# Patient Record
Sex: Male | Born: 2000 | Race: Black or African American | Hispanic: No | Marital: Single | Smoking: Never smoker
Health system: Southern US, Community
[De-identification: ages and names within clinical notes are randomized; demographics above are authoritative.]

## PROBLEM LIST (undated history)

## (undated) DIAGNOSIS — I219 Acute myocardial infarction, unspecified: Secondary | ICD-10-CM

---
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
## 2021-02-04 ENCOUNTER — Other Ambulatory Visit: Payer: Self-pay

## 2021-02-04 ENCOUNTER — Encounter: Payer: Self-pay | Admitting: Emergency Medicine

## 2021-02-04 ENCOUNTER — Observation Stay: Payer: Medicaid Other

## 2021-02-04 ENCOUNTER — Inpatient Hospital Stay
Admission: EM | Admit: 2021-02-04 | Discharge: 2021-02-08 | DRG: 282 | Disposition: A | Discharge status: Home / Self Care | Payer: Medicaid Other | Attending: Internal Medicine | Admitting: Internal Medicine

## 2021-02-04 ENCOUNTER — Emergency Department: Payer: Medicaid Other

## 2021-02-04 DIAGNOSIS — E785 Hyperlipidemia, unspecified: Secondary | ICD-10-CM | POA: Diagnosis present

## 2021-02-04 DIAGNOSIS — R079 Chest pain, unspecified: Secondary | ICD-10-CM | POA: Diagnosis not present

## 2021-02-04 DIAGNOSIS — E876 Hypokalemia: Secondary | ICD-10-CM | POA: Diagnosis not present

## 2021-02-04 DIAGNOSIS — I214 Non-ST elevation (NSTEMI) myocardial infarction: Principal | ICD-10-CM | POA: Diagnosis present

## 2021-02-04 DIAGNOSIS — D539 Nutritional anemia, unspecified: Secondary | ICD-10-CM | POA: Diagnosis present

## 2021-02-04 DIAGNOSIS — E8881 Metabolic syndrome: Secondary | ICD-10-CM | POA: Diagnosis present

## 2021-02-04 DIAGNOSIS — I2510 Atherosclerotic heart disease of native coronary artery without angina pectoris: Secondary | ICD-10-CM | POA: Diagnosis present

## 2021-02-04 DIAGNOSIS — Z6837 Body mass index (BMI) 37.0-37.9, adult: Secondary | ICD-10-CM

## 2021-02-04 DIAGNOSIS — Z20822 Contact with and (suspected) exposure to covid-19: Secondary | ICD-10-CM | POA: Diagnosis present

## 2021-02-04 DIAGNOSIS — E669 Obesity, unspecified: Secondary | ICD-10-CM | POA: Diagnosis present

## 2021-02-04 DIAGNOSIS — I255 Ischemic cardiomyopathy: Secondary | ICD-10-CM | POA: Diagnosis present

## 2021-02-04 DIAGNOSIS — K760 Fatty (change of) liver, not elsewhere classified: Secondary | ICD-10-CM | POA: Diagnosis present

## 2021-02-04 DIAGNOSIS — R778 Other specified abnormalities of plasma proteins: Secondary | ICD-10-CM

## 2021-02-04 DIAGNOSIS — D519 Vitamin B12 deficiency anemia, unspecified: Secondary | ICD-10-CM | POA: Diagnosis present

## 2021-02-04 DIAGNOSIS — I119 Hypertensive heart disease without heart failure: Secondary | ICD-10-CM | POA: Diagnosis present

## 2021-02-04 LAB — CBC
HCT: 32.6 % — ABNORMAL LOW (ref 39.0–52.0)
Hemoglobin: 11.7 g/dL — ABNORMAL LOW (ref 13.0–17.0)
MCH: 36.7 pg — ABNORMAL HIGH (ref 26.0–34.0)
MCHC: 35.9 g/dL (ref 30.0–36.0)
MCV: 102.2 fL — ABNORMAL HIGH (ref 80.0–100.0)
Platelets: 422 10*3/uL — ABNORMAL HIGH (ref 150–400)
RBC: 3.19 MIL/uL — ABNORMAL LOW (ref 4.22–5.81)
RDW: 18 % — ABNORMAL HIGH (ref 11.5–15.5)
WBC: 15.2 10*3/uL — ABNORMAL HIGH (ref 4.0–10.5)
nRBC: 0.2 % (ref 0.0–0.2)

## 2021-02-04 LAB — BASIC METABOLIC PANEL
Anion gap: 10 (ref 5–15)
BUN: 14 mg/dL (ref 6–20)
CO2: 23 mmol/L (ref 22–32)
Calcium: 9.8 mg/dL (ref 8.9–10.3)
Chloride: 106 mmol/L (ref 98–111)
Creatinine, Ser: 1.02 mg/dL (ref 0.61–1.24)
GFR, Estimated: >60 mL/min (ref >60)
Glucose, Bld: 160 mg/dL — ABNORMAL HIGH (ref 70–99)
Potassium: 3.8 mmol/L (ref 3.5–5.1)
Sodium: 139 mmol/L (ref 135–145)

## 2021-02-04 LAB — TROPONIN I (HIGH SENSITIVITY)
Troponin I (High Sensitivity): 236 ng/L (ref <18)
Troponin I (High Sensitivity): 426 ng/L (ref <18)

## 2021-02-04 LAB — RESP PANEL BY RT-PCR (FLU A&B, COVID) ARPGX2
Influenza A by PCR: NEGATIVE
Influenza B by PCR: NEGATIVE
SARS Coronavirus 2 by RT PCR: NEGATIVE

## 2021-02-04 MED ORDER — IOHEXOL 350 MG/ML SOLN
100.0000 mL | Freq: Once | INTRAVENOUS | Status: AC | PRN
  Administered 2021-02-04: 100 mL via INTRAVENOUS

## 2021-02-04 MED ORDER — ALUM & MAG HYDROXIDE-SIMETH 200-200-20 MG/5ML PO SUSP: 15.0000 mL | ORAL | Status: DC | PRN

## 2021-02-04 MED ORDER — COLCHICINE 0.6 MG PO TABS
0.6000 mg | ORAL_TABLET | Freq: Two times a day (BID) | ORAL | Status: DC
  Administered 2021-02-05 – 2021-02-06 (×5): 0.6 mg via ORAL
  Filled 2021-02-04 (×7): qty 1

## 2021-02-04 MED ORDER — ASPIRIN 325 MG PO TABS
325.0000 mg | ORAL_TABLET | Freq: Every day | ORAL | Status: DC
  Filled 2021-02-04: qty 1

## 2021-02-04 MED ORDER — ASPIRIN 81 MG PO CHEW: 81.0000 mg | CHEWABLE_TABLET | Freq: Every day | ORAL | Status: DC

## 2021-02-04 MED ORDER — IBUPROFEN 600 MG PO TABS
600.0000 mg | ORAL_TABLET | Freq: Four times a day (QID) | ORAL | Status: DC
  Administered 2021-02-05 (×2): 600 mg via ORAL
  Filled 2021-02-04 (×2): qty 1

## 2021-02-04 MED ORDER — ASPIRIN 81 MG PO CHEW
324.0000 mg | CHEWABLE_TABLET | Freq: Once | ORAL | Status: AC
  Administered 2021-02-04: 324 mg via ORAL

## 2021-02-04 MED ORDER — NITROGLYCERIN 0.4 MG SL SUBL: 0.4000 mg | SUBLINGUAL_TABLET | Freq: Once | SUBLINGUAL | Status: AC

## 2021-02-04 MED ORDER — ACETAMINOPHEN 325 MG PO TABS
650.0000 mg | ORAL_TABLET | ORAL | Status: DC | PRN
  Administered 2021-02-04 – 2021-02-07 (×2): 650 mg via ORAL
  Filled 2021-02-04: qty 2

## 2021-02-04 MED ORDER — NITROGLYCERIN 0.4 MG SL SUBL
SUBLINGUAL_TABLET | SUBLINGUAL | Status: AC
  Administered 2021-02-04: 0.4 mg
  Filled 2021-02-04: qty 1

## 2021-02-04 MED ORDER — HYDROCHLOROTHIAZIDE 12.5 MG PO CAPS
12.5000 mg | ORAL_CAPSULE | Freq: Every day | ORAL | Status: DC
  Administered 2021-02-05: 12.5 mg via ORAL
  Filled 2021-02-04: qty 1

## 2021-02-04 MED ORDER — ENOXAPARIN SODIUM 80 MG/0.8ML IJ SOSY
0.5000 mg/kg | PREFILLED_SYRINGE | INTRAMUSCULAR | Status: DC
  Administered 2021-02-04: 62.5 mg via SUBCUTANEOUS
  Filled 2021-02-04: qty 0.63

## 2021-02-04 MED ORDER — ONDANSETRON HCL 4 MG/2ML IJ SOLN: 4.0000 mg | Freq: Four times a day (QID) | INTRAMUSCULAR | Status: DC | PRN

## 2021-02-04 MED ORDER — ASPIRIN 81 MG PO CHEW
324.0000 mg | CHEWABLE_TABLET | Freq: Every day | ORAL | Status: DC
  Filled 2021-02-04: qty 4

## 2021-02-04 MED ORDER — COLCHICINE 0.6 MG PO TABS
0.6000 mg | ORAL_TABLET | Freq: Every day | ORAL | Status: DC
  Filled 2021-02-04: qty 1

## 2021-02-04 NOTE — ED Triage Notes (Signed)
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 Pt describes the pain in his chest as someone sitting on it. Pt reports felt nauseated as well.

## 2021-02-04 NOTE — Progress Notes (Signed)
Called to evaluate for possible code STEMI. Young, otherwise healthy male, first noted chest discomfort 02/02/2021, more CP today with nausea, presents to ED, ECG shows NSR with nonspecific ST elevation aVR only, with nonspecific T wave inversion lead III only. Admission labs notable for mildly elevated troponin 236, with elevated WBC 15,200 of uncertain etiology. No history for cocaine use. Possible myocarditis/pericarditis. No symptoms concerning for COVID. Does not meet criteria for STEMI or emergent cardiac catheterization. Would admit for observation, cardiology consultation, cycle cardiac isoenzymes, 2D echocardiogram.

## 2021-02-04 NOTE — Progress Notes (Signed)
PHARMACIST - PHYSICIAN COMMUNICATION  CONCERNING:  Enoxaparin (Lovenox) for DVT Prophylaxis    RECOMMENDATION: Patient was prescribed enoxaprin 40mg  q24 hours for VTE prophylaxis.   Filed Weights   02/04/21 1650  Weight: 127 kg (280 lb)    Body mass index is 37.97 kg/m.  Estimated Creatinine Clearance: 159.2 mL/min (by C-G formula based on SCr of 1.02 mg/dL).   Based on [facility] policy patient is candidate for enoxaparin 0.5mg /kg TBW SQ every 24 hours based on BMI being >30.   DESCRIPTION: Pharmacy has adjusted enoxaparin dose per [facility] policy.  Patient is now receiving enoxaparin 62.5 mg every 24 hours    [facility], PharmD Clinical Pharmacist  02/04/2021 8:09 PM

## 2021-02-04 NOTE — ED Notes (Signed)
Pt placed in hospital bed for comfort. Lights dimmed for comfort. Father with pt- updated on plan of care per pt request.

## 2021-02-04 NOTE — ED Notes (Signed)
Anwar, DO notified of 4 beats of v-tach at 2200.

## 2021-02-04 NOTE — ED Provider Notes (Signed)
[facility] Emergency Department Provider Note  ____________________________________________   I have reviewed the triage vital signs and the nursing notes.   HISTORY  Chief Complaint Chest Pain   History limited by: Not Limited   HPI Peter Monroe is a 20 y.o. male who presents to the emergency department today because of concern for chest pain. The patient states that the chest pain started two days ago. Located in the left center chest. Was getting slightly better, however today was painting and started feeling the pain again and it was worse. The patient denies any shortness of breath. Did have one episode of nausea and vomiting. The patient denies any recent illness, fevers. No recent cold like symptoms.    Records reviewed. Per medical record review no pertinent family history.   History reviewed. No pertinent past medical history.  There are no problems to display for this patient.   History reviewed. No pertinent surgical history.  Prior to Admission medications   Not on File    Allergies Patient has no known allergies.  No family history on file.  Social History Positive tobacco use Negative alcohol   Review of Systems Constitutional: No fever/chills Eyes: No visual changes. ENT: No sore throat. Cardiovascular: Positive for chest pain. Respiratory: Denies shortness of breath. Gastrointestinal: No abdominal pain.  Positive for nausea and vomiting.  Genitourinary: Negative for dysuria. Musculoskeletal: Negative for back pain. Skin: Negative for rash. Neurological: Negative for headaches, focal weakness or numbness.  ____________________________________________   PHYSICAL EXAM:  VITAL SIGNS: ED Triage Vitals  Enc Vitals Group     BP 02/04/21 1652 (!) 148/106     Pulse Rate 02/04/21 1652 86     Resp 02/04/21 1652 20     Temp 02/04/21 1652 98.7 F (37.1 C)     Temp Source 02/04/21 1652 Oral     SpO2 02/04/21 1652 100 %      Weight 02/04/21 1650 280 lb (127 kg)     Height 02/04/21 1650 6' (1.829 m)     Head Circumference --      Peak Flow --      Pain Score 02/04/21 1649 9   Constitutional: Alert and oriented.  Eyes: Conjunctivae are normal.  ENT      Head: Normocephalic and atraumatic.      Nose: No congestion/rhinnorhea.      Mouth/Throat: Mucous membranes are moist.      Neck: No stridor. Hematological/Lymphatic/Immunilogical: No cervical lymphadenopathy. Cardiovascular: Normal rate, regular rhythm.  No murmurs, rubs, or gallops.  Respiratory: Normal respiratory effort without tachypnea nor retractions. Breath sounds are clear and equal bilaterally. No wheezes/rales/rhonchi. Gastrointestinal: Soft and non tender. No rebound. No guarding.  Genitourinary: Deferred Musculoskeletal: Normal range of motion in all extremities. No lower extremity edema. Neurologic:  Normal speech and language. No gross focal neurologic deficits are appreciated.  Skin:  Skin is warm, dry and intact. No rash noted. Psychiatric: Mood and affect are normal. Speech and behavior are normal. Patient exhibits appropriate insight and judgment.  ____________________________________________    LABS (pertinent positives/negatives)  BMP wnl except glu 160 Trop hs 236 CBC wbc 15.2, hgb 11.7, plt 422  ____________________________________________   EKG  I, Phineas Semen, attending physician, personally viewed and interpreted this EKG  EKG Time: 1650 Rate: 81 Rhythm: sinus rhythm Axis: normal Intervals: qtc 429 QRS: narrow ST changes: st depression v4, v5, st elevation avr Impression: abnormal ekg   ____________________________________________    RADIOLOGY  CXR No active  cardiopulmonary disease  ____________________________________________   PROCEDURES  Procedures  CRITICAL CARE Performed by: Phineas Semen   Total critical care time: 35 minutes  Critical care time was exclusive of separately  billable procedures and treating other patients.  Critical care was necessary to treat or prevent imminent or life-threatening deterioration.  Critical care was time spent personally by me on the following activities: development of treatment plan with patient and/or surrogate as well as nursing, discussions with consultants, evaluation of patient's response to treatment, examination of patient, obtaining history from patient or surrogate, ordering and performing treatments and interventions, ordering and review of laboratory studies, ordering and review of radiographic studies, pulse oximetry and re-evaluation of patient's condition.  ____________________________________________   INITIAL IMPRESSION / ASSESSMENT AND PLAN / ED COURSE  Pertinent labs & imaging results that were available during my care of the patient were reviewed by me and considered in my medical decision making (see chart for details).   Patient presents to the emergency department today because of concern for chest pain. Initial troponin was elevated to 230s. The patient's ekg did show some ST depressions as well as ST elevation in avr. Did discuss with Dr. Darrold Junker with cardiology. At this time did not feel any emergent catheterization was warranted. Will plan on admission to the hospitalist.    ____________________________________________   FINAL CLINICAL IMPRESSION(S) / ED DIAGNOSES  Final diagnoses:  Chest pain, unspecified type  Elevated troponin     Note: This dictation was prepared with Dragon dictation. Any transcriptional errors that result from this process are unintentional     Phineas Semen, MD 02/04/21 1931

## 2021-02-04 NOTE — ED Notes (Signed)
Pt and family updated on current plan of care. Introduced self to [employer]. Pt given additional pillow, position adjusted, and pt given TV remote. Pt denies additional needs at this time.

## 2021-02-04 NOTE — H&P (Addendum)
History and Physical    Peter Monroe ION:[id number] DOB: [birth year] DOA: 02/04/2021  PCP: Oswaldo Conroy, MD  Chief Complaint: Chest pain  HPI: Peter Monroe is a 20 y.o. male with a past medical history of obesity, BMI 37, tobacco use disorder, marijuana use, diagnosed with elevated blood pressure at 20 years of age but not started on any oral antihypertensives.  The patient presents to the emergency department for chest pain that started 2 days ago.  Intermittent in nature.  Located center of the chest and right-sided.  Made worse today with exertion when he was outside painting with his dad.  Made better with aspirin.  Initial pain was 9 out of 10 in the emergency department.  Currently now 5 out of 10.  Non-palpational.  Nonpositional.  It is exertional.  He had 1 episode of nausea and vomiting.  No family history of myocardial infarction.  No cocaine use.  Denies any abdominal pain.  Denies any urinary complaints.  Denies any fevers or chills.  Denies any upper respiratory symptoms.  States he had upper respiratory symptoms approximately 1 or 2 months ago.    ED Course: Lab work obtained.  EKG obtained.  Chest x-ray obtained.  The ED provider did discuss the case with Dr. Darrold Junker.  Review of Systems: 14 point review of systems is negative except for what is mentioned above in the HPI.   History reviewed. No pertinent past medical history.  History reviewed. No pertinent surgical history.  Social History   Socioeconomic History   Marital status: Single    Spouse name: Not on file   Number of children: Not on file   Years of education: Not on file   Highest education level: Not on file  Occupational History   Not on file  Tobacco Use   Smoking status: Never   Smokeless tobacco: Never  Substance and Sexual Activity   Alcohol use: No    Alcohol/week: 0.0 standard drinks   Drug use: No   Sexual activity: Not on file  Other Topics Concern   Not on file  Social  History Narrative   Not on file   Social Determinants of Health   Financial Resource Strain: Not on file  Food Insecurity: Not on file  Transportation Needs: Not on file  Physical Activity: Not on file  Stress: Not on file  Social Connections: Not on file  Intimate Partner Violence: Not on file    No Known Allergies  No family history on file.  Prior to Admission medications   Not on File    Physical Exam: Vitals:   02/04/21 1920 02/04/21 1925 02/04/21 1930 02/04/21 2000  BP: (!) 144/84 (!) 144/86 139/77 (!) 143/79  Pulse: 78 75 73 67  Resp: 14 18 17 11   Temp:      TempSrc:      SpO2: 98% 100% 100% 99%  Weight:      Height:         General:  Appears calm and comfortable and is in NAD Cardiovascular:  RRR, no m/r/g.  Respiratory:   CTA bilaterally with no wheezes/rales/rhonchi.  Normal respiratory effort. Abdomen:  soft, NT, ND, NABS Skin:  no rash or induration seen on limited exam Musculoskeletal:  grossly normal tone BUE/BLE, good ROM, no bony abnormality Lower extremity:  No LE edema.  Limited foot exam with no ulcerations.  2+ distal pulses. Psychiatric:  grossly normal mood and affect, speech fluent and appropriate, AOx3 Neurologic:  CN  2-12 grossly intact, moves all extremities in coordinated fashion, sensation intact    Radiological Exams on Admission: Independently reviewed - see discussion in A/P where applicable  DG Chest 2 View  Result Date: 02/04/2021 CLINICAL DATA:  Chest pain. EXAM: CHEST - 2 VIEW COMPARISON:  None. FINDINGS: The heart size and mediastinal contours are within normal limits. Both lungs are clear. The visualized skeletal structures are unremarkable. IMPRESSION: No active cardiopulmonary disease. Electronically Signed   By: Lupita Raider M.D.   On: 02/04/2021 17:22    EKG: Independently reviewed.  Sinus rhythm rate 83 with nonspecific ST elevation in aVR, nonspecific T wave inversion in lead III.   Labs on Admission: I have  personally reviewed the available labs and imaging studies at the time of the admission.  Pertinent labs: Initial troponin 236, repeat troponin 426.  WBC 15, hemoglobin 11.7, hematocrit 32.6, platelets 422.     Assessment/Plan: Chest pain rule out: The patient will be admitted to the cardiac PCU under observation status.  The ED provider did touch base with the STEMI cardiologist on-call and reviewed the EKG with him.  Recommended not starting a heparin drip at this time.  Continue to cycle the cardiac enzymes.  Admit for observation.  Consult cardiology.  Obtain echocardiogram.  Differential includes myocarditis, pericarditis and possibly pulmonary embolism.  Hold off on anti-inflammatories like colchicine or antiplatelets with aspirin for now as we do not know if this is ACS or pericarditis related.  The chest pain is not positional or pleuritic to make me think this is pericarditis.  Hemodynamically stable.  COVID-19 PCR is negative.  Reports URI symptoms 1 to 2 months ago.  Denies any cocaine use.  Discussed with Dr. Lalla Brothers and he stated to proceed with a CT PE to rule out pulmonary embolism.  Elevated blood pressure with likely underlying hypertension: We will start hydrochlorothiazide 12.5 mg daily  Hyperglycemia: No known history of diabetes mellitus.  Blood sugar on initial presentation 160.  Obtain A1c to rule out diabetes mellitus.  Tobacco use: Counseled on tobacco cessation  Marijuana use: Counseled on cessation  Obese: BMI 37:  Counseled on lifestyle modifications  Anemia: No signs of overt bleeding.  Macrocytic.  Obtain iron studies and B12/folate  Leukocytosis: Likely reactive.  No clear source of infection at the moment.  Level of Care: Progressive cardiac DVT prophylaxis: Lovenox Code Status: Full code Consults: Cardiology Admission status: Observation   Verdia Kuba DO [facility]   How to contact the [facility] Attending or Consulting provider 7A - 7P or  covering provider during after hours 7P -7A, for this patient?  Check the care team in [facility] and look for a) attending/consulting [facility] provider listed and b) the [facility] team listed Log into [email or web address] and use [facility] universal password to access. If you do not have the password, please contact the hospital operator. Locate the [facility] provider you are looking for under [facility] and page to a number that you can be directly reached. If you still have difficulty reaching the provider, please page the [facility] (Director on Call) for the Hospitalists listed on amion for assistance.   02/04/2021, 8:14 PM

## 2021-02-05 ENCOUNTER — Other Ambulatory Visit: Payer: Self-pay

## 2021-02-05 ENCOUNTER — Encounter: Payer: Self-pay | Admitting: Internal Medicine

## 2021-02-05 ENCOUNTER — Observation Stay
Admit: 2021-02-05 | Discharge: 2021-02-05 | Disposition: A | Discharge status: Home / Self Care | Payer: Medicaid Other | Attending: Family Medicine | Admitting: Family Medicine

## 2021-02-05 DIAGNOSIS — E785 Hyperlipidemia, unspecified: Secondary | ICD-10-CM | POA: Diagnosis present

## 2021-02-05 DIAGNOSIS — I10 Essential (primary) hypertension: Secondary | ICD-10-CM

## 2021-02-05 DIAGNOSIS — I119 Hypertensive heart disease without heart failure: Secondary | ICD-10-CM | POA: Diagnosis present

## 2021-02-05 DIAGNOSIS — Z6837 Body mass index (BMI) 37.0-37.9, adult: Secondary | ICD-10-CM | POA: Diagnosis not present

## 2021-02-05 DIAGNOSIS — R7301 Impaired fasting glucose: Secondary | ICD-10-CM | POA: Diagnosis not present

## 2021-02-05 DIAGNOSIS — D539 Nutritional anemia, unspecified: Secondary | ICD-10-CM | POA: Insufficient documentation

## 2021-02-05 DIAGNOSIS — E876 Hypokalemia: Secondary | ICD-10-CM | POA: Diagnosis not present

## 2021-02-05 DIAGNOSIS — I255 Ischemic cardiomyopathy: Secondary | ICD-10-CM | POA: Diagnosis present

## 2021-02-05 DIAGNOSIS — I2510 Atherosclerotic heart disease of native coronary artery without angina pectoris: Secondary | ICD-10-CM | POA: Diagnosis present

## 2021-02-05 DIAGNOSIS — E669 Obesity, unspecified: Secondary | ICD-10-CM | POA: Insufficient documentation

## 2021-02-05 DIAGNOSIS — R778 Other specified abnormalities of plasma proteins: Secondary | ICD-10-CM | POA: Diagnosis present

## 2021-02-05 DIAGNOSIS — I214 Non-ST elevation (NSTEMI) myocardial infarction: Principal | ICD-10-CM

## 2021-02-05 DIAGNOSIS — Z20822 Contact with and (suspected) exposure to covid-19: Secondary | ICD-10-CM | POA: Diagnosis present

## 2021-02-05 DIAGNOSIS — E8881 Metabolic syndrome: Secondary | ICD-10-CM | POA: Diagnosis present

## 2021-02-05 DIAGNOSIS — K760 Fatty (change of) liver, not elsewhere classified: Secondary | ICD-10-CM | POA: Diagnosis present

## 2021-02-05 DIAGNOSIS — D519 Vitamin B12 deficiency anemia, unspecified: Secondary | ICD-10-CM | POA: Diagnosis present

## 2021-02-05 LAB — TROPONIN I (HIGH SENSITIVITY)
Troponin I (High Sensitivity): 11542 ng/L (ref <18)
Troponin I (High Sensitivity): 18403 ng/L (ref <18)

## 2021-02-05 LAB — HIV ANTIBODY (ROUTINE TESTING W REFLEX): HIV Screen 4th Generation wRfx: NONREACTIVE

## 2021-02-05 LAB — LIPID PANEL
Cholesterol: 172 mg/dL (ref 0–200)
HDL: 26 mg/dL — ABNORMAL LOW (ref >40)
LDL Cholesterol: 122 mg/dL — ABNORMAL HIGH (ref 0–99)
Total CHOL/HDL Ratio: 6.6 RATIO
Triglycerides: 118 mg/dL (ref <150)
VLDL: 24 mg/dL (ref 0–40)

## 2021-02-05 LAB — CBC
HCT: 30.6 % — ABNORMAL LOW (ref 39.0–52.0)
Hemoglobin: 10.9 g/dL — ABNORMAL LOW (ref 13.0–17.0)
MCH: 36.7 pg — ABNORMAL HIGH (ref 26.0–34.0)
MCHC: 35.6 g/dL (ref 30.0–36.0)
MCV: 103 fL — ABNORMAL HIGH (ref 80.0–100.0)
Platelets: 336 10*3/uL (ref 150–400)
RBC: 2.97 MIL/uL — ABNORMAL LOW (ref 4.22–5.81)
RDW: 18.5 % — ABNORMAL HIGH (ref 11.5–15.5)
WBC: 8.5 10*3/uL (ref 4.0–10.5)
nRBC: 0 % (ref 0.0–0.2)

## 2021-02-05 LAB — IRON AND TIBC
Iron: 172 ug/dL (ref 45–182)
Saturation Ratios: 56 % — ABNORMAL HIGH (ref 17.9–39.5)
TIBC: 307 ug/dL (ref 250–450)
UIBC: 135 ug/dL

## 2021-02-05 LAB — PROTIME-INR
INR: 1 (ref 0.8–1.2)
Prothrombin Time: 13.4 seconds (ref 11.4–15.2)

## 2021-02-05 LAB — URINE DRUG SCREEN, QUALITATIVE (ARMC ONLY)
Amphetamines, Ur Screen: NOT DETECTED
Barbiturates, Ur Screen: NOT DETECTED
Benzodiazepine, Ur Scrn: NOT DETECTED
Cannabinoid 50 Ng, Ur ~~LOC~~: POSITIVE — AB
Cocaine Metabolite,Ur ~~LOC~~: NOT DETECTED
MDMA (Ecstasy)Ur Screen: NOT DETECTED
Methadone Scn, Ur: NOT DETECTED
Opiate, Ur Screen: NOT DETECTED
Phencyclidine (PCP) Ur S: NOT DETECTED
Tricyclic, Ur Screen: NOT DETECTED

## 2021-02-05 LAB — FOLATE: Folate: 34 ng/mL (ref >5.9)

## 2021-02-05 LAB — T4, FREE: Free T4: 0.86 ng/dL (ref 0.61–1.12)

## 2021-02-05 LAB — HEPARIN LEVEL (UNFRACTIONATED): Heparin Unfractionated: 0.17 IU/mL — ABNORMAL LOW (ref 0.30–0.70)

## 2021-02-05 LAB — FERRITIN: Ferritin: 312 ng/mL (ref 24–336)

## 2021-02-05 LAB — VITAMIN B12: Vitamin B-12: <50 pg/mL — ABNORMAL LOW (ref 180–914)

## 2021-02-05 LAB — C-REACTIVE PROTEIN: CRP: 0.6 mg/dL (ref <1.0)

## 2021-02-05 LAB — APTT: aPTT: 30 seconds (ref 24–36)

## 2021-02-05 LAB — TSH: TSH: 1.451 u[IU]/mL (ref 0.350–4.500)

## 2021-02-05 MED ORDER — ASPIRIN EC 81 MG PO TBEC
81.0000 mg | DELAYED_RELEASE_TABLET | Freq: Every day | ORAL | Status: DC
  Administered 2021-02-05 – 2021-02-08 (×4): 81 mg via ORAL
  Filled 2021-02-05 (×3): qty 1

## 2021-02-05 MED ORDER — ATORVASTATIN CALCIUM 20 MG PO TABS: 40.0000 mg | ORAL_TABLET | Freq: Every evening | ORAL | Status: DC

## 2021-02-05 MED ORDER — HEPARIN (PORCINE) 25000 UT/250ML-% IV SOLN
1850.0000 [IU]/h | INTRAVENOUS | Status: DC
  Administered 2021-02-05: 1500 [IU]/h via INTRAVENOUS
  Administered 2021-02-05 – 2021-02-07 (×3): 1850 [IU]/h via INTRAVENOUS
  Filled 2021-02-05 (×4): qty 250

## 2021-02-05 MED ORDER — HEPARIN BOLUS VIA INFUSION
3200.0000 [IU] | Freq: Once | INTRAVENOUS | Status: AC
  Administered 2021-02-05: 3200 [IU] via INTRAVENOUS
  Filled 2021-02-05: qty 3200

## 2021-02-05 MED ORDER — ASPIRIN 81 MG PO CHEW
CHEWABLE_TABLET | ORAL | Status: AC
  Filled 2021-02-05: qty 1

## 2021-02-05 MED ORDER — HEPARIN BOLUS VIA INFUSION
4000.0000 [IU] | Freq: Once | INTRAVENOUS | Status: AC
  Administered 2021-02-05: 4000 [IU] via INTRAVENOUS
  Filled 2021-02-05: qty 4000

## 2021-02-05 MED ORDER — PERFLUTREN LIPID MICROSPHERE
1.0000 mL | INTRAVENOUS | Status: AC | PRN
  Administered 2021-02-05: 3 mL via INTRAVENOUS
  Filled 2021-02-05: qty 10

## 2021-02-05 MED ORDER — ATORVASTATIN CALCIUM 80 MG PO TABS
80.0000 mg | ORAL_TABLET | Freq: Every day | ORAL | Status: DC
  Administered 2021-02-05 – 2021-02-08 (×4): 80 mg via ORAL
  Filled 2021-02-05 (×3): qty 1

## 2021-02-05 NOTE — ED Notes (Signed)
MD at bedside. 

## 2021-02-05 NOTE — ED Notes (Signed)
Pt ambulated to toilet in room, no assistance

## 2021-02-05 NOTE — ED Notes (Signed)
Pt provided toothbrush and toothpaste. Ambulatory to sink with steady gait. Family at bedside.

## 2021-02-05 NOTE — Consult Note (Addendum)
Cardiology Consultation:   Patient ID: Peter Monroe MRN: [id number]; DOB: [birth year]  Admit date: 02/04/2021 Date of Consult: 02/05/2021  PCP:  Oswaldo Conroy, MD   [facility] Providers Cardiologist:  New1}     Patient Profile:   Peter Monroe is a 20 y.o. male with a hx of obesity, tobacco use, marijuana use, untreated HTN who is being seen 02/05/2021 for the evaluation of chest pain at the request of Dr. Hilton Sinclair.  History of Present Illness:   Peter Monroe has not significant prior cardiac history.     Pt say sthat Wednesday (3 days ago) he was at home   Sitting around  Ate a bowl of cereal.  Developed SSCP   Light  then got worse  Took a nap      Woke up was better   Minimally there   Thursday  No CP   Didn't do much   Friday   Got up   Went to help dad paint   Feeling fine   Took a break  Went to store to get a drink   Then started getting  CP  Building     Felt bad   Couldn't get out of car   Rolled down window and vomited   Felt some better   Went into store  Still some discomrfort   Got tyleonol and drink   Driving home pain came back again     At home still with discomfort   Took shower to see if fell better   Had to sit  Went back into rooom   Felt bad    Came to [place] then ED Pt says pain not pleuritic   Not positional   He says he feels uneasy with symtpoms    Restless  Cant get comfortable   Says he does not feel SOB   He  denies f/c  no recent illness  Had a uri 2 to 3 months ago  Mild   Recovered     Denies other symtpoms   No F/C   No joint issues  No GI  No eye issures Smokes some marijuana   no cigs   No ETOH  no cocaine No Fhx of CAD  ' Got here to ER at [facility]   Given NTG at 7     Eased to minimall Around midnight had Pain again and then given colchicine and then after 1 hour eased    Then fell asleep   Felt a lot better      This am a little sore   3 or 4/10     Trop 236, 426, and now 11,000   EKG with NSR, 81bpm, TWI III, ST depression II  and minimal lateral leads. Negative respiratory panel. CTA chest unremarkable with no PE.   Echo this am shows hypkinesis /akinesis of apex   LVEF about 40% (prelim)   History reviewed. No pertinent past medical history.  History reviewed. No pertinent surgical history.   Home Medications:  Prior to Admission medications   Not on File    Inpatient Medications: Scheduled Meds:  colchicine  0.6 mg Oral BID   enoxaparin (LOVENOX) injection  0.5 mg/kg Subcutaneous Q24H   hydrochlorothiazide  12.5 mg Oral Daily   ibuprofen  600 mg Oral QID   Continuous Infusions:  PRN Meds: acetaminophen, alum & mag hydroxide-simeth, ondansetron (ZOFRAN) IV  Allergies:   No Known Allergies  Social History:   Social History  Socioeconomic History   Marital status: Single    Spouse name: Not on file   Number of children: Not on file   Years of education: Not on file   Highest education level: Not on file  Occupational History   Not on file  Tobacco Use   Smoking status: Never   Smokeless tobacco: Never  Substance and Sexual Activity   Alcohol use: No    Alcohol/week: 0.0 standard drinks   Drug use: No   Sexual activity: Not on file  Other Topics Concern   Not on file  Social History Narrative   Not on file   Social Determinants of Health   Financial Resource Strain: Not on file  Food Insecurity: Not on file  Transportation Needs: Not on file  Physical Activity: Not on file  Stress: Not on file  Social Connections: Not on file  Intimate Partner Violence: Not on file    Family History:   No family history on file.   ROS:  Please see the history of present illness.   All other ROS reviewed and negative.     Physical Exam/Data:   Vitals:   02/04/21 2200 02/05/21 0030 02/05/21 0200 02/05/21 0500  BP: 127/76  125/74 118/72  Pulse: 79 84 89 68  Resp: (!) 23 13 (!) 22 18  Temp:      TempSrc:      SpO2: 100% 100% 97% 99%  Weight:      Height:       No intake or  output data in the 24 hours ending 02/05/21 0753 Last 3 Weights 02/04/2021  Weight (lbs) 280 lb  Weight (kg) 127.007 kg     Body mass index is 37.97 kg/m.  General:  Well nourished, well developed, in no acute distress HEENT: norma Neck: no JVD  no bruits  Endocrine:  No thryomegaly Vascular: No carotid bruits; FA pulses 2+ bilaterally without bruits  Cardiac:  normal S1, S2; RRR; no murmur  Lungs:  clear to auscultation bilaterally, no wheezing, rhonchi or rales  Abd: soft, nontender, no hepatomegaly  Ext: no edema Musculoskeletal:  No deformities, BUE and BLE strength normal and equal Skin: warm and dry  Neuro:  CNs 2-12 intact, no focal abnormalities noted Psych:  Normal affect   EKG:  The EKG was personally reviewed and demonstrates:  INitial EKG   SR 81  Nonspecific ST changes  T wave inversion III # 2  SR   SL ST Depression in I, II, AVF, V4-V6   Sl Elevation in III, AVR    Telemetry:  Telemetry was personally reviewed and demonstrates:  SR with few short bursts of NSVT  (4 beats)    Relevant CV Studies: Echo as noted   Official pending   Laboratory Data:  High Sensitivity Troponin:   Recent Labs  Lab 02/04/21 1652 02/04/21 1844  TROPONINIHS 236* 426*     Chemistry Recent Labs  Lab 02/04/21 1652  NA 139  K 3.8  CL 106  CO2 23  GLUCOSE 160*  BUN 14  CREATININE 1.02  CALCIUM 9.8  GFRNONAA >60  ANIONGAP 10    No results for input(s): PROT, ALBUMIN, AST, ALT, ALKPHOS, BILITOT in the last 168 hours. Hematology Recent Labs  Lab 02/04/21 1652 02/05/21 0706  WBC 15.2* 8.5  RBC 3.19* 2.97*  HGB 11.7* 10.9*  HCT 32.6* 30.6*  MCV 102.2* 103.0*  MCH 36.7* 36.7*  MCHC 35.9 35.6  RDW 18.0* 18.5*  PLT 422* 336  BNPNo results for input(s): BNP, PROBNP in the last 168 hours.  DDimer No results for input(s): DDIMER in the last 168 hours.   Radiology/Studies:  DG Chest 2 View  Result Date: 02/04/2021 CLINICAL DATA:  Chest pain. EXAM: CHEST - 2 VIEW  COMPARISON:  None. FINDINGS: The heart size and mediastinal contours are within normal limits. Both lungs are clear. The visualized skeletal structures are unremarkable. IMPRESSION: No active cardiopulmonary disease. Electronically Signed   By: Lupita Raider M.D.   On: 02/04/2021 17:22   CT Angio Chest Pulmonary Embolism (PE) W or WO Contrast  Result Date: 02/04/2021 CLINICAL DATA:  Shortness of breath, chest heaviness EXAM: CT ANGIOGRAPHY CHEST WITH CONTRAST TECHNIQUE: Multidetector CT imaging of the chest was performed using the standard protocol during bolus administration of intravenous contrast. Multiplanar CT image reconstructions and MIPs were obtained to evaluate the vascular anatomy. CONTRAST:  OMNIPAQUE IOHEXOL 350 MG/ML SOLN COMPARISON:  Radiograph 02/04/2021 FINDINGS: Cardiovascular: Borderline opacification of pulmonary arteries with a central pulmonary artery contrast bolus of 178 Hounsfield units. May limit detection of smaller segmental and subsegmental pulmonary artery emboli. No central or lobar filling defects are identified. Pulmonary trunk appears borderline dilated though is likely accentuated by cardiac pulsation when viewed on coronal reconstruction. Left and right main pulmonary arteries remain a more normal caliber. Normal heart size. No pericardial effusion. Motion artifact limits evaluation of the aortic root and ascending aorta. No gross acute aortic abnormality. The aorta is normal caliber. Shared origin of the brachiocephalic and left common carotid arteries. No major venous abnormality or significant venous reflux. Mediastinum/Nodes: Fatty stippling in the anterior mediastinum may reflect thymic remnant in a patient of this age. No mediastinal fluid or gas. Normal thyroid gland and thoracic inlet. No acute abnormality of the trachea or esophagus. No worrisome mediastinal, hilar or axillary adenopathy. Lungs/Pleura: Airways patent. No consolidation, features of edema,  pneumothorax, or effusion. No suspicious pulmonary nodules or masses. Upper Abdomen: Diffuse hepatic hypoattenuation compatible with hepatic steatosis. Sparing along gallbladder fossa. No acute abnormalities present in the visualized portions of the upper abdomen. Musculoskeletal: No acute osseous abnormality or suspicious osseous lesion. Review of the MIP images confirms the above findings. IMPRESSION: Suboptimal contrast opacification of the pulmonary arteries may limit detection of small segmental and subsegmental filling defects. No large central or lobar filling defects are identified. Accentuation of the pulmonary trunk likely due to cardiac pulsation artifact. No clear central pulmonary arterial enlargement accounting for this artifact. No other acute intrathoracic process. Diffuse hepatic hypoattenuation, most often reflective of hepatic steatosis. Electronically Signed   By: Kreg Shropshire M.D.   On: 02/04/2021 21:59     Assessment and Plan:   NSTEMI  Pt with 2 days of chest discomfort (coming , going )  SOme on Wednesday  More severe yesterday  Finally came to ED  EKG with subtle changes of depression and only elevation in III, AVR (not signfi)      Troponin peak so far 11,000.  Echo with akinesis of the apex/distal 1/3 of LV     Moderate dysfunction    Suspicious for Takosubo's possibly   ? Bridge vs spasm vs dissection vs CAD vs malformation of coronary artery  Plan:   Admit to ICU/step down for veryclose monitoring  given age and changes noted    IV heparin, ASA, statin (for antiinflamm properties), NTG, MSO4 for pain   Will follow BP and HR re b blockers    Plan for L heart  cath on Monday to evaluate coronary anatomy     I have had a televisit with mother along with son to review findings      Risk / benefits of catheterization discussed   Pt and family understand and agree to proceed  Reviewed with interventional from our service as well in case plans change   2   HTN   pt with hx of  borderline HTN    LV is mildly thickened proximally   Will follow BP here   Was never on an agent  3   Obesity    Pt with hepatc steatosis     Will need to discuss dietary changes  Low sugar, low carbs, low cal      Long term goal to avoid further complications from metabolic disorders  Check Hgb [id number]    4  Lipids   Will check  Start high dose statin for now       For questions or updates, please contact [facility] Please consult [email or web address] for contact info under    Signed, Cadence David StallH Furth, PA-C  02/05/2021 7:53 AM

## 2021-02-05 NOTE — Consult Note (Signed)
ANTICOAGULATION CONSULT NOTE - Initial Consult  Pharmacy Consult for Heparin  Indication: chest pain/ACS  No Known Allergies  Patient Measurements: Height: 6' (182.9 cm) Weight: 127 kg (280 lb) IBW/kg (Calculated) : 77.6 Heparin Dosing Weight: 106 kg  Vital Signs: BP: 140/78 (07/23 0817) Pulse Rate: 91 (07/23 1100)  Labs: Recent Labs    02/04/21 1652 02/04/21 1844 02/05/21 0706 02/05/21 1006  HGB 11.7*  --  10.9*  --   HCT 32.6*  --  30.6*  --   PLT 422*  --  336  --   APTT  --   --   --  30  LABPROT  --   --   --  13.4  INR  --   --   --  1.0  CREATININE 1.02  --   --   --   TROPONINIHS 236* 426* 11,542*  --     Estimated Creatinine Clearance: 159.2 mL/min (by C-G formula based on SCr of 1.02 mg/dL).   Medical History: History reviewed. No pertinent past medical history.  Medications:  No prior PTA anticoagulation medications  Assessment: Pharmacy has been consulted to initiate heparin infusion in 19yo patient with 2 days of chest discomfort with peak troponin level of 11,000. Patient with no history of anticoagulation PTA medication use. Of note, did receive enoxaparin dose@2315  on 06/07/21. After discussion with Cards, since enoxaparin dose was for DVT ppx and not a treatment dose as well as given the patient's weight, a bolus heparin dose will be ordered to accompany the continuous infusion.   Goal of Therapy:  Heparin level 0.3-0.7 units/ml Monitor platelets by anticoagulation protocol: Yes   Plan:  Give 4000 units bolus x 1 Start heparin infusion at 1500 units/hr Check anti-Xa level in 6 hours and daily while on heparin Continue to monitor H&H and platelets  Jillyn Stacey A Ailis Rigaud 02/05/2021,11:15 AM

## 2021-02-05 NOTE — ED Notes (Signed)
Echo at bedside

## 2021-02-05 NOTE — ED Notes (Signed)
Breakfast tray provided to pt.

## 2021-02-05 NOTE — Plan of Care (Signed)

## 2021-02-05 NOTE — Consult Note (Signed)
ANTICOAGULATION CONSULT NOTE - Initial Consult  Pharmacy Consult for Heparin  Indication: chest pain/ACS  No Known Allergies  Patient Measurements: Height: 6' (182.9 cm) Weight: 119.4 kg (263 lb 4.8 oz) IBW/kg (Calculated) : 77.6 Heparin Dosing Weight: 106 kg  Vital Signs: Temp: 98.9 F (37.2 C) (07/23 1544) Temp Source: Oral (07/23 1124) BP: 134/75 (07/23 1544) Pulse Rate: 75 (07/23 1544)  Labs: Recent Labs    02/04/21 1652 02/04/21 1844 02/05/21 0706 02/05/21 1006 02/05/21 1700  HGB 11.7*  --  10.9*  --   --   HCT 32.6*  --  30.6*  --   --   PLT 422*  --  336  --   --   APTT  --   --   --  30  --   LABPROT  --   --   --  13.4  --   INR  --   --   --  1.0  --   HEPARINUNFRC  --   --   --   --  0.17*  CREATININE 1.02  --   --   --   --   TROPONINIHS 236* 426* 11,542*  --   --      Estimated Creatinine Clearance: 154.1 mL/min (by C-G formula based on SCr of 1.02 mg/dL).   Medical History: History reviewed. No pertinent past medical history.  Medications:  No prior PTA anticoagulation medications  Assessment: Pharmacy has been consulted to initiate heparin infusion in 19yo patient with 2 days of chest discomfort with peak troponin level of 11,000. Patient with no history of anticoagulation PTA medication use. Of note, did receive enoxaparin dose@2315  on 02/04/21. After discussion with Cards, since enoxaparin dose was for DVT ppx and not a treatment dose as well as given the patient's weight, a bolus heparin dose will be ordered to accompany the continuous infusion.  Date/Time HL Comment 7/23 @1700  0.17 Subtherapeutic - confirmed with nurse no issues with heparin line/infusion   Goal of Therapy:  Heparin level 0.3-0.7 units/ml Monitor platelets by anticoagulation protocol: Yes   Plan:  HL subtherapeutic, will order 3200 unit bolus and increase heparin infusion to 1850 units/hr Check anti-Xa level in 6 hours after rate change and daily while on  heparin Continue to monitor H&H and platelets  Shatia Sindoni O Tanna Loeffler 02/05/2021,5:35 PM

## 2021-02-05 NOTE — ED Notes (Signed)
Dr Wieting at bedside 

## 2021-02-05 NOTE — Progress Notes (Addendum)
Patient ID: Peter Monroe, male   DOB: 28-Aug-2000, 20 y.o.   MRN: [id number] [facility] PROGRESS NOTE  Peter Monroe MPN:[id number] DOB: 03-25-2001 DOA: 02/04/2021 PCP: Oswaldo Conroy, MD  HPI/Subjective: Patient complains of chest pain and cardiac enzymes were noted.  No shortness of breath.  Sometimes he does have some sweating.  Admitted with NSTEMI versus myocarditis.  Objective: Vitals:   02/05/21 1230 02/05/21 1300  BP:    Pulse: 78 90  Resp: 20 15  Temp:    SpO2: 100% 93%    Intake/Output Summary (Last 24 hours) at 02/05/2021 1323 Last data filed at 02/05/2021 1051 Gross per 24 hour  Intake 48.9 ml  Output --  Net 48.9 ml   Filed Weights   02/04/21 1650  Weight: 127 kg    ROS: Review of Systems  Respiratory:  Negative for shortness of breath.   Cardiovascular:  Positive for chest pain.  Gastrointestinal:  Negative for abdominal pain, nausea and vomiting.  Exam: Physical Exam HENT:     Head: Normocephalic.     Mouth/Throat:     Pharynx: No oropharyngeal exudate.  Eyes:     General: Lids are normal.     Conjunctiva/sclera: Conjunctivae normal.     Pupils: Pupils are equal, round, and reactive to light.  Cardiovascular:     Rate and Rhythm: Normal rate and regular rhythm.     Heart sounds: Normal heart sounds, S1 normal and S2 normal.  Pulmonary:     Breath sounds: No decreased breath sounds, wheezing, rhonchi or rales.  Abdominal:     Palpations: Abdomen is soft.     Tenderness: There is no abdominal tenderness.  Musculoskeletal:     Right lower leg: No swelling.     Left lower leg: No swelling.  Skin:    General: Skin is warm.     Findings: No rash.  Neurological:     Mental Status: He is alert and oriented to person, place, and time.     Data Reviewed: Basic Metabolic Panel: Recent Labs  Lab 02/04/21 1652  NA 139  K 3.8  CL 106  CO2 23  GLUCOSE 160*  BUN 14  CREATININE 1.02  CALCIUM 9.8   CBC: Recent Labs  Lab  02/04/21 1652 02/05/21 0706  WBC 15.2* 8.5  HGB 11.7* 10.9*  HCT 32.6* 30.6*  MCV 102.2* 103.0*  PLT 422* 336     Recent Results (from the past 240 hour(s))  Resp Panel by RT-PCR (Flu A&B, Covid) Nasopharyngeal Swab     Status: None   Collection Time: 02/04/21  6:43 PM   Specimen: Nasopharyngeal Swab; Nasopharyngeal(NP) swabs in vial transport medium  Result Value Ref Range Status   SARS Coronavirus 2 by RT PCR NEGATIVE NEGATIVE Final    Comment: (NOTE) SARS-CoV-2 target nucleic acids are NOT DETECTED.  The SARS-CoV-2 RNA is generally detectable in upper respiratory specimens during the acute phase of infection. The lowest concentration of SARS-CoV-2 viral copies this assay can detect is 138 copies/mL. A negative result does not preclude SARS-Cov-2 infection and should not be used as the sole basis for treatment or other patient management decisions. A negative result may occur with  improper specimen collection/handling, submission of specimen other than nasopharyngeal swab, presence of viral mutation(s) within the areas targeted by this assay, and inadequate number of viral copies(<138 copies/mL). A negative result must be combined with clinical observations, patient history, and epidemiological information. The expected result is Negative.  Fact Sheet  for Patients:  BloggerCourse.com  Fact Sheet for Healthcare Providers:  SeriousBroker.it  This test is no t yet approved or cleared by the [place] FDA and  has been authorized for detection and/or diagnosis of SARS-CoV-2 by FDA under an Emergency Use Authorization (EUA). This EUA will remain  in effect (meaning this test can be used) for the duration of the COVID-19 declaration under Section 564(b)(1) of the Act, 21 U.S.C.section 360bbb-3(b)(1), unless the authorization is terminated  or revoked sooner.       Influenza A by PCR NEGATIVE NEGATIVE Final   Influenza  B by PCR NEGATIVE NEGATIVE Final    Comment: (NOTE) The Xpert Xpress SARS-CoV-2/FLU/RSV plus assay is intended as an aid in the diagnosis of influenza from Nasopharyngeal swab specimens and should not be used as a sole basis for treatment. Nasal washings and aspirates are unacceptable for Xpert Xpress SARS-CoV-2/FLU/RSV testing.  Fact Sheet for Patients: BloggerCourse.com  Fact Sheet for Healthcare Providers: SeriousBroker.it  This test is not yet approved or cleared by the [place] FDA and has been authorized for detection and/or diagnosis of SARS-CoV-2 by FDA under an Emergency Use Authorization (EUA). This EUA will remain in effect (meaning this test can be used) for the duration of the COVID-19 declaration under Section 564(b)(1) of the Act, 21 U.S.C. section 360bbb-3(b)(1), unless the authorization is terminated or revoked.  Performed at [facility], [address], [place], [place] [address]      Studies: DG Chest 2 View  Result Date: 02/04/2021 CLINICAL DATA:  Chest pain. EXAM: CHEST - 2 VIEW COMPARISON:  None. FINDINGS: The heart size and mediastinal contours are within normal limits. Both lungs are clear. The visualized skeletal structures are unremarkable. IMPRESSION: No active cardiopulmonary disease. Electronically Signed   By: Lupita Raider M.D.   On: 02/04/2021 17:22   CT Angio Chest Pulmonary Embolism (PE) W or WO Contrast  Result Date: 02/04/2021 CLINICAL DATA:  Shortness of breath, chest heaviness EXAM: CT ANGIOGRAPHY CHEST WITH CONTRAST TECHNIQUE: Multidetector CT imaging of the chest was performed using the standard protocol during bolus administration of intravenous contrast. Multiplanar CT image reconstructions and MIPs were obtained to evaluate the vascular anatomy. CONTRAST:  OMNIPAQUE IOHEXOL 350 MG/ML SOLN COMPARISON:  Radiograph 02/04/2021 FINDINGS: Cardiovascular: Borderline  opacification of pulmonary arteries with a central pulmonary artery contrast bolus of 178 Hounsfield units. May limit detection of smaller segmental and subsegmental pulmonary artery emboli. No central or lobar filling defects are identified. Pulmonary trunk appears borderline dilated though is likely accentuated by cardiac pulsation when viewed on coronal reconstruction. Left and right main pulmonary arteries remain a more normal caliber. Normal heart size. No pericardial effusion. Motion artifact limits evaluation of the aortic root and ascending aorta. No gross acute aortic abnormality. The aorta is normal caliber. Shared origin of the brachiocephalic and left common carotid arteries. No major venous abnormality or significant venous reflux. Mediastinum/Nodes: Fatty stippling in the anterior mediastinum may reflect thymic remnant in a patient of this age. No mediastinal fluid or gas. Normal thyroid gland and thoracic inlet. No acute abnormality of the trachea or esophagus. No worrisome mediastinal, hilar or axillary adenopathy. Lungs/Pleura: Airways patent. No consolidation, features of edema, pneumothorax, or effusion. No suspicious pulmonary nodules or masses. Upper Abdomen: Diffuse hepatic hypoattenuation compatible with hepatic steatosis. Sparing along gallbladder fossa. No acute abnormalities present in the visualized portions of the upper abdomen. Musculoskeletal: No acute osseous abnormality or suspicious osseous lesion. Review of the MIP images  confirms the above findings. IMPRESSION: Suboptimal contrast opacification of the pulmonary arteries may limit detection of small segmental and subsegmental filling defects. No large central or lobar filling defects are identified. Accentuation of the pulmonary trunk likely due to cardiac pulsation artifact. No clear central pulmonary arterial enlargement accounting for this artifact. No other acute intrathoracic process. Diffuse hepatic hypoattenuation, most often  reflective of hepatic steatosis. Electronically Signed   By: Kreg Shropshire M.D.   On: 02/04/2021 21:59    Scheduled Meds:  aspirin EC  81 mg Oral Daily   atorvastatin  80 mg Oral Daily   colchicine  0.6 mg Oral BID   hydrochlorothiazide  12.5 mg Oral Daily   Continuous Infusions:  heparin 1,500 Units/hr (02/05/21 1051)    Assessment/Plan:  NSTEMI versus myocarditis.  The patient has rising troponins and last troponin 11,542.  Case discussed with cardiology and heparin drip aspirin and atorvastatin ordered.  Patient was empirically put on colchicine just in case pericarditis myocarditis.  Echocardiogram with slightly reduced ejection fraction.  Cardiology to set up for cardiac cath on Monday.  Spoke with nursing supervisor about bed options in the hospital.  Also spoke with nursing supervisor about the patient's visitors. Impaired fasting glucose.  Hemoglobin A1c pending. Hepatic steatosis Macrocytic anemia Hyperlipidemia unspecified atorvastatin.  LDL 122 Obesity with a BMI of 37.97 Essential hypertension.  Started on hydrochlorothiazide.  Depending on cardiac cath results will depend on which meds patient will go home with.        Code Status:     Code Status Orders  (From admission, onward)           Start     Ordered   02/04/21 2001  Full code  Continuous        02/04/21 2003           Code Status History     This patient has a current code status but no historical code status.      Family Communication: Spoke with girlfriend at bedside Disposition Plan: Status is: Observation  Dispo: The patient is from: Home              Anticipated d/c is to: Home              Patient currently on heparin drip and likely will have cardiac catheterization on Monday   Difficult to place patient.  No  Consultants: Cardiology  Time spent: 29 minutes  Arrick [employer]

## 2021-02-05 NOTE — ED Notes (Signed)
Lab at bedside

## 2021-02-06 DIAGNOSIS — D519 Vitamin B12 deficiency anemia, unspecified: Secondary | ICD-10-CM

## 2021-02-06 DIAGNOSIS — R778 Other specified abnormalities of plasma proteins: Secondary | ICD-10-CM

## 2021-02-06 DIAGNOSIS — R7989 Other specified abnormal findings of blood chemistry: Secondary | ICD-10-CM

## 2021-02-06 LAB — ECHOCARDIOGRAM COMPLETE
AR max vel: 3.29 cm2
AV Area VTI: 2.99 cm2
AV Area mean vel: 2.96 cm2
AV Mean grad: 4 mmHg
AV Peak grad: 6.5 mmHg
Ao pk vel: 1.27 m/s
Area-P 1/2: 4.58 cm2
Calc EF: 55 %
Height: 72 in
S' Lateral: 2.86 cm
Single Plane A2C EF: 59.4 %
Single Plane A4C EF: 48.6 %
Weight: 4480 oz

## 2021-02-06 LAB — HEPARIN LEVEL (UNFRACTIONATED)
Heparin Unfractionated: 0.34 IU/mL (ref 0.30–0.70)
Heparin Unfractionated: 0.34 IU/mL (ref 0.30–0.70)

## 2021-02-06 LAB — CBC
HCT: 32.4 % — ABNORMAL LOW (ref 39.0–52.0)
Hemoglobin: 11.5 g/dL — ABNORMAL LOW (ref 13.0–17.0)
MCH: 35.7 pg — ABNORMAL HIGH (ref 26.0–34.0)
MCHC: 35.5 g/dL (ref 30.0–36.0)
MCV: 100.6 fL — ABNORMAL HIGH (ref 80.0–100.0)
Platelets: 355 10*3/uL (ref 150–400)
RBC: 3.22 MIL/uL — ABNORMAL LOW (ref 4.22–5.81)
RDW: 17.8 % — ABNORMAL HIGH (ref 11.5–15.5)
WBC: 8.9 10*3/uL (ref 4.0–10.5)
nRBC: 0 % (ref 0.0–0.2)

## 2021-02-06 LAB — TROPONIN I (HIGH SENSITIVITY): Troponin I (High Sensitivity): 12675 ng/L (ref <18)

## 2021-02-06 LAB — SEDIMENTATION RATE: Sed Rate: 21 mm/hr — ABNORMAL HIGH (ref 0–15)

## 2021-02-06 MED ORDER — SODIUM CHLORIDE 0.9 % IV SOLN: 250.0000 mL | INTRAVENOUS | Status: DC | PRN

## 2021-02-06 MED ORDER — METOPROLOL TARTRATE 25 MG PO TABS: 12.5000 mg | ORAL_TABLET | Freq: Four times a day (QID) | ORAL | Status: DC

## 2021-02-06 MED ORDER — SODIUM CHLORIDE 0.9 % WEIGHT BASED INFUSION
3.0000 mL/kg/h | INTRAVENOUS | Status: DC
Start: 2021-02-07 — End: 2021-02-07
  Administered 2021-02-07: 3 mL/kg/h via INTRAVENOUS

## 2021-02-06 MED ORDER — METOPROLOL TARTRATE 25 MG PO TABS
12.5000 mg | ORAL_TABLET | Freq: Three times a day (TID) | ORAL | Status: DC
  Administered 2021-02-06 (×3): 12.5 mg via ORAL
  Filled 2021-02-06 (×3): qty 1

## 2021-02-06 MED ORDER — SODIUM CHLORIDE 0.9 % WEIGHT BASED INFUSION
1.0000 mL/kg/h | INTRAVENOUS | Status: DC
  Administered 2021-02-07: 1 mL/kg/h via INTRAVENOUS

## 2021-02-06 MED ORDER — ASPIRIN 81 MG PO CHEW
81.0000 mg | CHEWABLE_TABLET | ORAL | Status: AC
  Administered 2021-02-07: 81 mg via ORAL
  Filled 2021-02-06: qty 1

## 2021-02-06 MED ORDER — SODIUM CHLORIDE 0.9% FLUSH
3.0000 mL | Freq: Two times a day (BID) | INTRAVENOUS | Status: DC
  Administered 2021-02-06: 3 mL via INTRAVENOUS

## 2021-02-06 MED ORDER — CYANOCOBALAMIN 1000 MCG/ML IJ SOLN
1000.0000 ug | Freq: Every day | INTRAMUSCULAR | Status: DC
  Administered 2021-02-06: 1000 ug via INTRAMUSCULAR
  Filled 2021-02-06 (×3): qty 1

## 2021-02-06 MED ORDER — METOPROLOL SUCCINATE ER 25 MG PO TB24: 12.5000 mg | ORAL_TABLET | Freq: Every day | ORAL | Status: DC

## 2021-02-06 MED ORDER — SODIUM CHLORIDE 0.9% FLUSH
3.0000 mL | INTRAVENOUS | Status: DC | PRN
Start: 2021-02-06 — End: 2021-02-07

## 2021-02-06 NOTE — Consult Note (Signed)
ANTICOAGULATION CONSULT NOTE - Initial Consult  Pharmacy Consult for Heparin  Indication: chest pain/ACS  No Known Allergies  Patient Measurements: Height: 6' (182.9 cm) Weight: 119.4 kg (263 lb 4.8 oz) IBW/kg (Calculated) : 77.6 Heparin Dosing Weight: 106 kg  Vital Signs: Temp: 98.6 F (37 C) (07/23 1930) Temp Source: Oral (07/23 1930) BP: 125/74 (07/23 1930) Pulse Rate: 88 (07/23 1930)  Labs: Recent Labs    02/04/21 1652 02/04/21 1844 02/05/21 0706 02/05/21 1006 02/05/21 1700 02/05/21 2232 02/05/21 2342  HGB 11.7*  --  10.9*  --   --   --   --   HCT 32.6*  --  30.6*  --   --   --   --   PLT 422*  --  336  --   --   --   --   APTT  --   --   --  30  --   --   --   LABPROT  --   --   --  13.4  --   --   --   INR  --   --   --  1.0  --   --   --   HEPARINUNFRC  --   --   --   --  0.17*  --  0.34  CREATININE 1.02  --   --   --   --   --   --   TROPONINIHS 236* 426* 11,542*  --   --  18,403*  --      Estimated Creatinine Clearance: 154.1 mL/min (by C-G formula based on SCr of 1.02 mg/dL).   Medical History: History reviewed. No pertinent past medical history.  Medications:  No prior PTA anticoagulation medications  Assessment: Pharmacy has been consulted to initiate heparin infusion in 20yo patient with 2 days of chest discomfort with peak troponin level of 11,000. Patient with no history of anticoagulation PTA medication use. Of note, did receive enoxaparin dose@2315  on 02/04/21. After discussion with Cards, since enoxaparin dose was for DVT ppx and not a treatment dose as well as given the patient's weight, a bolus heparin dose will be ordered to accompany the continuous infusion.  Date/Time HL Comment 7/23 @1700  0.17 Subtherapeutic - confirmed with nurse no issues with heparin line/infusion 7/23 @2342    0.34    Therapeutic    Goal of Therapy:  Heparin level 0.3-0.7 units/ml Monitor platelets by anticoagulation protocol: Yes   Plan:  7/23:  HL @ 2342 =  0.34 Will continue pt on current rate and recheck HL on 7/24 @ 0600.   Makayle Krahn D 02/06/2021,12:47 AM

## 2021-02-06 NOTE — Progress Notes (Addendum)
Patient ID: Peter Monroe, male   DOB: 06-13-2001, 20 y.o.   MRN: [id number] [facility] PROGRESS NOTE  Peter Monroe NWG:[id number] DOB: 06-13-2001 DOA: 02/04/2021 PCP: Oswaldo ConroyBender, Abby Daneele, MD  HPI/Subjective: Patient feeling fine today.  He states that his chest pain went away yesterday morning and has not returned.  Feels okay walking back and forth to the bathroom.  Came in with chest pain.  Cardiac enzyme very elevated.  On medication for NSTEMI and myocarditis  Objective: Vitals:   02/06/21 0752 02/06/21 1211  BP: 123/87 121/72  Pulse: 80 83  Resp: 18 18  Temp: 98.5 F (36.9 C) 98.5 F (36.9 C)  SpO2: 98% 97%    Intake/Output Summary (Last 24 hours) at 02/06/2021 1605 Last data filed at 02/06/2021 1251 Gross per 24 hour  Intake 673.16 ml  Output --  Net 673.16 ml   Filed Weights   02/04/21 1650 02/05/21 1534 02/06/21 1256  Weight: 127 kg 119.4 kg 119.4 kg    ROS: Review of Systems  Respiratory:  Negative for shortness of breath.   Cardiovascular:  Negative for chest pain.  Gastrointestinal:  Negative for abdominal pain, nausea and vomiting.  Exam: Physical Exam HENT:     Head: Normocephalic.     Mouth/Throat:     Pharynx: No oropharyngeal exudate.  Eyes:     General: Lids are normal.     Conjunctiva/sclera: Conjunctivae normal.  Cardiovascular:     Rate and Rhythm: Normal rate and regular rhythm.     Heart sounds: Normal heart sounds, S1 normal and S2 normal.  Pulmonary:     Breath sounds: No decreased breath sounds, wheezing, rhonchi or rales.  Abdominal:     Palpations: Abdomen is soft.     Tenderness: There is no abdominal tenderness.  Musculoskeletal:     Right lower leg: No swelling.     Left lower leg: No swelling.  Skin:    General: Skin is warm.     Findings: No rash.  Neurological:     Mental Status: He is alert and oriented to person, place, and time.     Data Reviewed: Basic Metabolic Panel: Recent Labs  Lab 02/04/21 1652   NA 139  K 3.8  CL 106  CO2 23  GLUCOSE 160*  BUN 14  CREATININE 1.02  CALCIUM 9.8    CBC: Recent Labs  Lab 02/04/21 1652 02/05/21 0706 02/06/21 0553  WBC 15.2* 8.5 8.9  HGB 11.7* 10.9* 11.5*  HCT 32.6* 30.6* 32.4*  MCV 102.2* 103.0* 100.6*  PLT 422* 336 355     Recent Results (from the past 240 hour(s))  Resp Panel by RT-PCR (Flu A&B, Covid) Nasopharyngeal Swab     Status: None   Collection Time: 02/04/21  6:43 PM   Specimen: Nasopharyngeal Swab; Nasopharyngeal(NP) swabs in vial transport medium  Result Value Ref Range Status   SARS Coronavirus 2 by RT PCR NEGATIVE NEGATIVE Final    Comment: (NOTE) SARS-CoV-2 target nucleic acids are NOT DETECTED.  The SARS-CoV-2 RNA is generally detectable in upper respiratory specimens during the acute phase of infection. The lowest concentration of SARS-CoV-2 viral copies this assay can detect is 138 copies/mL. A negative result does not preclude SARS-Cov-2 infection and should not be used as the sole basis for treatment or other patient management decisions. A negative result may occur with  improper specimen collection/handling, submission of specimen other than nasopharyngeal swab, presence of viral mutation(s) within the areas targeted by this assay, and  inadequate number of viral copies(<138 copies/mL). A negative result must be combined with clinical observations, patient history, and epidemiological information. The expected result is Negative.  Fact Sheet for Patients:  BloggerCourse.com  Fact Sheet for Healthcare Providers:  SeriousBroker.it  This test is no t yet approved or cleared by the [place] FDA and  has been authorized for detection and/or diagnosis of SARS-CoV-2 by FDA under an Emergency Use Authorization (EUA). This EUA will remain  in effect (meaning this test can be used) for the duration of the COVID-19 declaration under Section 564(b)(1) of the  Act, 21 U.S.C.section 360bbb-3(b)(1), unless the authorization is terminated  or revoked sooner.       Influenza A by PCR NEGATIVE NEGATIVE Final   Influenza B by PCR NEGATIVE NEGATIVE Final    Comment: (NOTE) The Xpert Xpress SARS-CoV-2/FLU/RSV plus assay is intended as an aid in the diagnosis of influenza from Nasopharyngeal swab specimens and should not be used as a sole basis for treatment. Nasal washings and aspirates are unacceptable for Xpert Xpress SARS-CoV-2/FLU/RSV testing.  Fact Sheet for Patients: BloggerCourse.com  Fact Sheet for Healthcare Providers: SeriousBroker.it  This test is not yet approved or cleared by the [place] FDA and has been authorized for detection and/or diagnosis of SARS-CoV-2 by FDA under an Emergency Use Authorization (EUA). This EUA will remain in effect (meaning this test can be used) for the duration of the COVID-19 declaration under Section 564(b)(1) of the Act, 21 U.S.C. section 360bbb-3(b)(1), unless the authorization is terminated or revoked.  Performed at [facility], [address], [place], [place] [address]      Studies: DG Chest 2 View  Result Date: 02/04/2021 CLINICAL DATA:  Chest pain. EXAM: CHEST - 2 VIEW COMPARISON:  None. FINDINGS: The heart size and mediastinal contours are within normal limits. Both lungs are clear. The visualized skeletal structures are unremarkable. IMPRESSION: No active cardiopulmonary disease. Electronically Signed   By: Lupita Raider M.D.   On: 02/04/2021 17:22   CT Angio Chest Pulmonary Embolism (PE) W or WO Contrast  Result Date: 02/04/2021 CLINICAL DATA:  Shortness of breath, chest heaviness EXAM: CT ANGIOGRAPHY CHEST WITH CONTRAST TECHNIQUE: Multidetector CT imaging of the chest was performed using the standard protocol during bolus administration of intravenous contrast. Multiplanar CT image reconstructions and MIPs were obtained  to evaluate the vascular anatomy. CONTRAST:  OMNIPAQUE IOHEXOL 350 MG/ML SOLN COMPARISON:  Radiograph 02/04/2021 FINDINGS: Cardiovascular: Borderline opacification of pulmonary arteries with a central pulmonary artery contrast bolus of 178 Hounsfield units. May limit detection of smaller segmental and subsegmental pulmonary artery emboli. No central or lobar filling defects are identified. Pulmonary trunk appears borderline dilated though is likely accentuated by cardiac pulsation when viewed on coronal reconstruction. Left and right main pulmonary arteries remain a more normal caliber. Normal heart size. No pericardial effusion. Motion artifact limits evaluation of the aortic root and ascending aorta. No gross acute aortic abnormality. The aorta is normal caliber. Shared origin of the brachiocephalic and left common carotid arteries. No major venous abnormality or significant venous reflux. Mediastinum/Nodes: Fatty stippling in the anterior mediastinum may reflect thymic remnant in a patient of this age. No mediastinal fluid or gas. Normal thyroid gland and thoracic inlet. No acute abnormality of the trachea or esophagus. No worrisome mediastinal, hilar or axillary adenopathy. Lungs/Pleura: Airways patent. No consolidation, features of edema, pneumothorax, or effusion. No suspicious pulmonary nodules or masses. Upper Abdomen: Diffuse hepatic hypoattenuation compatible with hepatic steatosis. Sparing along  gallbladder fossa. No acute abnormalities present in the visualized portions of the upper abdomen. Musculoskeletal: No acute osseous abnormality or suspicious osseous lesion. Review of the MIP images confirms the above findings. IMPRESSION: Suboptimal contrast opacification of the pulmonary arteries may limit detection of small segmental and subsegmental filling defects. No large central or lobar filling defects are identified. Accentuation of the pulmonary trunk likely due to cardiac pulsation artifact. No  clear central pulmonary arterial enlargement accounting for this artifact. No other acute intrathoracic process. Diffuse hepatic hypoattenuation, most often reflective of hepatic steatosis. Electronically Signed   By: Kreg Shropshire M.D.   On: 02/04/2021 21:59   ECHOCARDIOGRAM COMPLETE  Result Date: 02/06/2021    ECHOCARDIOGRAM REPORT   Patient Name:   Peter Monroe Date of Exam: 02/05/2021 Medical Rec #:  [id number]        Height:       72.0 in Accession #:    [id number]       Weight:       280.0 lb Date of Birth:  2001/02/08        BSA:          2.458 m Patient Age:    19 years         BP:           118/72 mmHg Patient Gender: M                HR:           68 bpm. Exam Location:  [facility] Procedure: 2D Echo and Intracardiac Opacification Agent Indications:     Chest Pain  History:         Patient has no prior history of Echocardiogram examinations.                  Risk Factors:Current Smoker.  Sonographer:     L Thornton-Maynard Referring Phys:  [id number] Verdia Kuba Diagnosing Phys: Adrian Blackwater MD IMPRESSIONS  1. Left ventricular ejection fraction, by estimation, is 50 to 55%. The left ventricle has low normal function. The left ventricle has no regional wall motion abnormalities. There is severe concentric left ventricular hypertrophy. Left ventricular diastolic parameters are consistent with Grade II diastolic dysfunction (pseudonormalization).  2. Right ventricular systolic function is normal. The right ventricular size is normal. There is normal pulmonary artery systolic pressure.  3. The mitral valve is normal in structure. Mild to moderate mitral valve regurgitation. No evidence of mitral stenosis.  4. The aortic valve is normal in structure. Aortic valve regurgitation is trivial. Mild aortic valve sclerosis is present, with no evidence of aortic valve stenosis.  5. The inferior vena cava is normal in size with greater than 50% respiratory variability, suggesting right atrial pressure of 3 mmHg. FINDINGS   Left Ventricle: Left ventricular ejection fraction, by estimation, is 50 to 55%. The left ventricle has low normal function. The left ventricle has no regional wall motion abnormalities. Definity contrast agent was given IV to delineate the left ventricular endocardial borders. The left ventricular internal cavity size was normal in size. There is severe concentric left ventricular hypertrophy. Left ventricular diastolic parameters are consistent with Grade II diastolic dysfunction (pseudonormalization). Right Ventricle: The right ventricular size is normal. No increase in right ventricular wall thickness. Right ventricular systolic function is normal. There is normal pulmonary artery systolic pressure. The tricuspid regurgitant velocity is 1.88 m/s, and  with an assumed right atrial pressure of 3 mmHg, the estimated right ventricular systolic pressure  is 17.1 mmHg. Left Atrium: Left atrial size was normal in size. Right Atrium: Right atrial size was normal in size. Pericardium: There is no evidence of pericardial effusion. Mitral Valve: The mitral valve is normal in structure. Mild to moderate mitral valve regurgitation. No evidence of mitral valve stenosis. Tricuspid Valve: The tricuspid valve is normal in structure. Tricuspid valve regurgitation is mild . No evidence of tricuspid stenosis. Aortic Valve: The aortic valve is normal in structure. Aortic valve regurgitation is trivial. Mild aortic valve sclerosis is present, with no evidence of aortic valve stenosis. Aortic valve mean gradient measures 4.0 mmHg. Aortic valve peak gradient measures 6.5 mmHg. Aortic valve area, by VTI measures 2.99 cm. Pulmonic Valve: The pulmonic valve was normal in structure. Pulmonic valve regurgitation is not visualized. No evidence of pulmonic stenosis. Aorta: The aortic root is normal in size and structure. Venous: The inferior vena cava is normal in size with greater than 50% respiratory variability, suggesting right atrial  pressure of 3 mmHg. IAS/Shunts: No atrial level shunt detected by color flow Doppler.  LEFT VENTRICLE PLAX 2D LVIDd:         5.25 cm      Diastology LVIDs:         2.86 cm      LV e' medial:    6.53 cm/s LV PW:         1.44 cm      LV E/e' medial:  16.7 LV IVS:        1.21 cm      LV e' lateral:   7.40 cm/s LVOT diam:     2.20 cm      LV E/e' lateral: 14.7 LV SV:         75 LV SV Index:   31 LVOT Area:     3.80 cm  LV Volumes (MOD) LV vol d, MOD A2C: 159.0 ml LV vol d, MOD A4C: 112.5 ml LV vol s, MOD A2C: 64.6 ml LV vol s, MOD A4C: 57.8 ml LV SV MOD A2C:     94.4 ml LV SV MOD A4C:     112.5 ml LV SV MOD BP:      77.6 ml RIGHT VENTRICLE TAPSE (M-mode): 2.3 cm LEFT ATRIUM             Index LA diam:        4.10 cm 1.67 cm/m LA Vol (A2C):   65.7 ml 26.73 ml/m LA Vol (A4C):   39.8 ml 16.19 ml/m LA Biplane Vol: 52.0 ml 21.15 ml/m  AORTIC VALVE                   PULMONIC VALVE AV Area (Vmax):    3.29 cm    PV Vmax:       0.91 m/s AV Area (Vmean):   2.96 cm    PV Peak grad:  3.3 mmHg AV Area (VTI):     2.99 cm AV Vmax:           127.00 cm/s AV Vmean:          92.100 cm/s AV VTI:            0.252 m AV Peak Grad:      6.5 mmHg AV Mean Grad:      4.0 mmHg LVOT Vmax:         110.00 cm/s LVOT Vmean:        71.600 cm/s LVOT VTI:  0.198 m LVOT/AV VTI ratio: 0.79  AORTA Ao Root diam: 3.20 cm MITRAL VALVE                TRICUSPID VALVE MV Area (PHT): 4.58 cm     TR Peak grad:   14.1 mmHg MV E velocity: 109.00 cm/s  TR Vmax:        188.00 cm/s MV A velocity: 46.30 cm/s MV E/A ratio:  2.35         SHUNTS                             Systemic VTI:  0.20 m                             Systemic Diam: 2.20 cm Adrian Blackwater MD Electronically signed by Adrian Blackwater MD Signature Date/Time: 02/06/2021/9:27:15 AM    Final     Scheduled Meds:  Melene Muller ON 02/07/2021] aspirin  81 mg Oral Pre-Cath   aspirin EC  81 mg Oral Daily   atorvastatin  80 mg Oral Daily   colchicine  0.6 mg Oral BID   metoprolol tartrate  12.5 mg Oral TID    Continuous Infusions:  heparin 1,850 Units/hr (02/06/21 1251)    Assessment/Plan:  NSTEMI versus myocarditis.  Patient had rising troponins that peaked at 18,403 and now down to 12,675.  Patient on heparin drip, aspirin, metoprolol, Lipitor.  LDL 122.  Cardiac cath for tomorrow. Impaired fasting glucose.  Hemoglobin A1c still pending. Hepatic steatosis Vitamin B12 deficiency with macrocytic anemia we will give IM B12 injections. Hyperlipidemia unspecified with LDL of 122.  On atorvastatin. Obesity with a BMI of 35.71 Essential hypertension on metoprolol        Code Status:     Code Status Orders  (From admission, onward)           Start     Ordered   02/04/21 2001  Full code  Continuous        02/04/21 2003           Code Status History     This patient has a current code status but no historical code status.      Family Communication: Spoke with patient's mother Fredonia Highland on the phone.  Her phone number is [phone] Disposition Plan: Status is: Inpatient   Dispo: The patient is from: Home              Anticipated d/c is to: Home              Patient currently scheduled for cardiac catheterization tomorrow   Difficult to place patient.  No  Consultants: Cardiology  Time spent: 27 minutes, case discussed with cardiology.  Orrie Lascano [employer]  [facility] [employer]

## 2021-02-06 NOTE — Consult Note (Signed)
ANTICOAGULATION CONSULT NOTE - Initial Consult  Pharmacy Consult for Heparin  Indication: chest pain/ACS  No Known Allergies  Patient Measurements: Height: 6' (182.9 cm) Weight: 119.4 kg (263 lb 4.8 oz) IBW/kg (Calculated) : 77.6 Heparin Dosing Weight: 106 kg  Vital Signs: Temp: 98.2 F (36.8 C) (07/24 0430) Temp Source: Oral (07/24 0430) BP: 105/65 (07/24 0430) Pulse Rate: 80 (07/24 0430)  Labs: Recent Labs    02/04/21 1652 02/04/21 1844 02/05/21 0706 02/05/21 1006 02/05/21 1700 02/05/21 2232 02/05/21 2342 02/06/21 0553  HGB 11.7*  --  10.9*  --   --   --   --  11.5*  HCT 32.6*  --  30.6*  --   --   --   --  32.4*  PLT 422*  --  336  --   --   --   --  355  APTT  --   --   --  30  --   --   --   --   LABPROT  --   --   --  13.4  --   --   --   --   INR  --   --   --  1.0  --   --   --   --   HEPARINUNFRC  --   --   --   --  0.17*  --  0.34 0.34  CREATININE 1.02  --   --   --   --   --   --   --   TROPONINIHS 236* 426* 11,542*  --   --  18,403*  --   --      Estimated Creatinine Clearance: 154.1 mL/min (by C-G formula based on SCr of 1.02 mg/dL).   Medical History: History reviewed. No pertinent past medical history.  Medications:  No prior PTA anticoagulation medications  Assessment: Pharmacy has been consulted to initiate heparin infusion in 20yo patient with 2 days of chest discomfort with peak troponin level of 11,000. Patient with no history of anticoagulation PTA medication use. Of note, did receive enoxaparin dose@2315  on 02/04/21. After discussion with Cards, since enoxaparin dose was for DVT ppx and not a treatment dose as well as given the patient's weight, a bolus heparin dose will be ordered to accompany the continuous infusion.  Date/Time HL Comment 7/23 @1700  0.17 Subtherapeutic - confirmed with nurse no issues with heparin line/infusion 7/23 @2342    0.34    Therapeutic  7/24 @0553    0.34    Therapeutic X 2    Goal of Therapy:  Heparin level  0.3-0.7 units/ml Monitor platelets by anticoagulation protocol: Yes   Plan:  7/24:  HL @ 0553 = 0.34 Will continue pt on current rate and recheck HL on 7/25 with AM labs.   Leovardo Thoman D 02/06/2021,7:25 AM

## 2021-02-06 NOTE — Progress Notes (Signed)
Progress Note  Patient Name: Peter Monroe Date of Encounter: 02/06/2021  [facility] Cardiologist: New  Subjective   Patient says he feels pretty good  no CP   NO SOB     Inpatient Medications    Scheduled Meds:  aspirin EC  81 mg Oral Daily   atorvastatin  80 mg Oral Daily   colchicine  0.6 mg Oral BID   Continuous Infusions:  heparin 1,850 Units/hr (02/05/21 2134)   PRN Meds: acetaminophen, alum & mag hydroxide-simeth, ondansetron (ZOFRAN) IV   Vital Signs    Vitals:   02/05/21 1544 02/05/21 1930 02/06/21 0430 02/06/21 0752  BP: 134/75 125/74 105/65 123/87  Pulse: 75 88 80 80  Resp: 16 18 (!) 21 18  Temp: 98.9 F (37.2 C) 98.6 F (37 C) 98.2 F (36.8 C) 98.5 F (36.9 C)  TempSrc:  Oral Oral Oral  SpO2: 100% 100% 100% 98%  Weight:      Height:        Intake/Output Summary (Last 24 hours) at 02/06/2021 3875 Last data filed at 02/05/2021 2121 Gross per 24 hour  Intake 339.4 ml  Output --  Net 339.4 ml   Last 3 Weights 02/05/2021 02/04/2021  Weight (lbs) 263 lb 4.8 oz 280 lb  Weight (kg) 119.432 kg 127.007 kg      Telemetry    SR   Short bursts of probable ST; less likely atrial tach   - Personally Reviewed  ECG     Repeat ordered  - Personally Reviewed  Physical Exam   GEN: Obese 20 yo  acute distress.   Neck: No JVD Cardiac: RRR, no murmurs,no rubs   Respiratory: Clear to auscultation bilaterally. GI: Soft, nontender, non-distended  MS: No edema; No deformity. Neuro:  Nonfocal  Psych: Normal affect   Labs    High Sensitivity Troponin:   Recent Labs  Lab 02/04/21 1652 02/04/21 1844 02/05/21 0706 02/05/21 2232  TROPONINIHS 236* 426* 11,542* 18,403*      Chemistry Recent Labs  Lab 02/04/21 1652  NA 139  K 3.8  CL 106  CO2 23  GLUCOSE 160*  BUN 14  CREATININE 1.02  CALCIUM 9.8  GFRNONAA >60  ANIONGAP 10     Hematology Recent Labs  Lab 02/04/21 1652 02/05/21 0706 02/06/21 0553  WBC 15.2* 8.5 8.9  RBC 3.19*  2.97* 3.22*  HGB 11.7* 10.9* 11.5*  HCT 32.6* 30.6* 32.4*  MCV 102.2* 103.0* 100.6*  MCH 36.7* 36.7* 35.7*  MCHC 35.9 35.6 35.5  RDW 18.0* 18.5* 17.8*  PLT 422* 336 355    BNPNo results for input(s): BNP, PROBNP in the last 168 hours.   DDimer No results for input(s): DDIMER in the last 168 hours.   Radiology    DG Chest 2 View  Result Date: 02/04/2021 CLINICAL DATA:  Chest pain. EXAM: CHEST - 2 VIEW COMPARISON:  None. FINDINGS: The heart size and mediastinal contours are within normal limits. Both lungs are clear. The visualized skeletal structures are unremarkable. IMPRESSION: No active cardiopulmonary disease. Electronically Signed   By: Lupita Raider M.D.   On: 02/04/2021 17:22   CT Angio Chest Pulmonary Embolism (PE) W or WO Contrast  Result Date: 02/04/2021 CLINICAL DATA:  Shortness of breath, chest heaviness EXAM: CT ANGIOGRAPHY CHEST WITH CONTRAST TECHNIQUE: Multidetector CT imaging of the chest was performed using the standard protocol during bolus administration of intravenous contrast. Multiplanar CT image reconstructions and MIPs were obtained to evaluate the vascular anatomy. CONTRAST:  OMNIPAQUE IOHEXOL 350 MG/ML SOLN COMPARISON:  Radiograph 02/04/2021 FINDINGS: Cardiovascular: Borderline opacification of pulmonary arteries with a central pulmonary artery contrast bolus of 178 Hounsfield units. May limit detection of smaller segmental and subsegmental pulmonary artery emboli. No central or lobar filling defects are identified. Pulmonary trunk appears borderline dilated though is likely accentuated by cardiac pulsation when viewed on coronal reconstruction. Left and right main pulmonary arteries remain a more normal caliber. Normal heart size. No pericardial effusion. Motion artifact limits evaluation of the aortic root and ascending aorta. No gross acute aortic abnormality. The aorta is normal caliber. Shared origin of the brachiocephalic and left common carotid arteries.  No major venous abnormality or significant venous reflux. Mediastinum/Nodes: Fatty stippling in the anterior mediastinum may reflect thymic remnant in a patient of 20. No mediastinal fluid or gas. Normal thyroid gland and thoracic inlet. No acute abnormality of the trachea or esophagus. No worrisome mediastinal, hilar or axillary adenopathy. Lungs/Pleura: Airways patent. No consolidation, features of edema, pneumothorax, or effusion. No suspicious pulmonary nodules or masses. Upper Abdomen: Diffuse hepatic hypoattenuation compatible with hepatic steatosis. Sparing along gallbladder fossa. No acute abnormalities present in the visualized portions of the upper abdomen. Musculoskeletal: No acute osseous abnormality or suspicious osseous lesion. Review of the MIP images confirms the above findings. IMPRESSION: Suboptimal contrast opacification of the pulmonary arteries may limit detection of small segmental and subsegmental filling defects. No large central or lobar filling defects are identified. Accentuation of the pulmonary trunk likely due to cardiac pulsation artifact. No clear central pulmonary arterial enlargement accounting for this artifact. No other acute intrathoracic process. Diffuse hepatic hypoattenuation, most often reflective of hepatic steatosis. Electronically Signed   By: Kreg Shropshire M.D.   On: 02/04/2021 21:59    Cardiac Studies     Patient Profile     20 y.o. male who presents with CP and NSTEMI  Assessment & Plan    1  NSTEMI   Pt with CP Wed which resolved  Then CP recurred on Friday    Severe   Presented finally to [facility] ED late Friday.  EKG with no ST elevation   Mild ST depression   Troponins:  236 / 426 / 11,542  /  18, 403.  Echo done  On my review  Without Definity: APex had prominent trabeculations (initial thought of possible apical hypertrophy)  RV function also appears a loittle down.   With Definity use highlighted akinesis of the distal portion of the LV  LVEF  approximantly 40% (see image 67)  Pt currrently on heparin.  Also on colchicine (which was continued since yesterday)   I do not think this represents myocarditis   Although focal myocarditis could happen the pt's clinical hx does not support this,     Can continue colchicine for now as, except for diarrhea has  low risk  Would recomm L heart cath tomorrow to define anatomy   Risk and benefits have already been discussed with pt and family   Agree to proceed Continue heparin , ASA, lipitor   Add low dose b blocker   2  Rhythm   Mainly SR    HE has had a few short bursts of rapid heart rates 130s to 150   Short lived   Probably ST  Follow     Will add low dose B blocker today decrease O2 demand  3  Lipds  LDL 122, HDL 26  Trig 118    On statin now  4  Blood pressure   BP has been OK here  Follow   5  Obesity   Again, reviewed recomm of sugar (36 g added per day) for male     Pt with fatty changes liver on CT     A1C is pending    For questions or updates, please contact [facility] Please consult [email or web address] for contact info under        Signed, Dietrich Pates, MD  02/06/2021, 8:52 AM

## 2021-02-06 NOTE — H&P (View-Only) (Signed)
Progress Note  Patient Name: Peter Monroe Date of Encounter: 02/06/2021  [facility] Cardiologist: New  Subjective   Patient says he feels pretty good  no CP   NO SOB     Inpatient Medications    Scheduled Meds:  aspirin EC  81 mg Oral Daily   atorvastatin  80 mg Oral Daily   colchicine  0.6 mg Oral BID   Continuous Infusions:  heparin 1,850 Units/hr (02/05/21 2134)   PRN Meds: acetaminophen, alum & mag hydroxide-simeth, ondansetron (ZOFRAN) IV   Vital Signs    Vitals:   02/05/21 1544 02/05/21 1930 02/06/21 0430 02/06/21 0752  BP: 134/75 125/74 105/65 123/87  Pulse: 75 88 80 80  Resp: 16 18 (!) 21 18  Temp: 98.9 F (37.2 C) 98.6 F (37 C) 98.2 F (36.8 C) 98.5 F (36.9 C)  TempSrc:  Oral Oral Oral  SpO2: 100% 100% 100% 98%  Weight:      Height:        Intake/Output Summary (Last 24 hours) at 02/06/2021 3875 Last data filed at 02/05/2021 2121 Gross per 24 hour  Intake 339.4 ml  Output --  Net 339.4 ml   Last 3 Weights 02/05/2021 02/04/2021  Weight (lbs) 263 lb 4.8 oz 280 lb  Weight (kg) 119.432 kg 127.007 kg      Telemetry    SR   Short bursts of probable ST; less likely atrial tach   - Personally Reviewed  ECG     Repeat ordered  - Personally Reviewed  Physical Exam   GEN: Obese 20 yo  acute distress.   Neck: No JVD Cardiac: RRR, no murmurs,no rubs   Respiratory: Clear to auscultation bilaterally. GI: Soft, nontender, non-distended  MS: No edema; No deformity. Neuro:  Nonfocal  Psych: Normal affect   Labs    High Sensitivity Troponin:   Recent Labs  Lab 02/04/21 1652 02/04/21 1844 02/05/21 0706 02/05/21 2232  TROPONINIHS 236* 426* 11,542* 18,403*      Chemistry Recent Labs  Lab 02/04/21 1652  NA 139  K 3.8  CL 106  CO2 23  GLUCOSE 160*  BUN 14  CREATININE 1.02  CALCIUM 9.8  GFRNONAA >60  ANIONGAP 10     Hematology Recent Labs  Lab 02/04/21 1652 02/05/21 0706 02/06/21 0553  WBC 15.2* 8.5 8.9  RBC 3.19*  2.97* 3.22*  HGB 11.7* 10.9* 11.5*  HCT 32.6* 30.6* 32.4*  MCV 102.2* 103.0* 100.6*  MCH 36.7* 36.7* 35.7*  MCHC 35.9 35.6 35.5  RDW 18.0* 18.5* 17.8*  PLT 422* 336 355    BNPNo results for input(s): BNP, PROBNP in the last 168 hours.   DDimer No results for input(s): DDIMER in the last 168 hours.   Radiology    DG Chest 2 View  Result Date: 02/04/2021 CLINICAL DATA:  Chest pain. EXAM: CHEST - 2 VIEW COMPARISON:  None. FINDINGS: The heart size and mediastinal contours are within normal limits. Both lungs are clear. The visualized skeletal structures are unremarkable. IMPRESSION: No active cardiopulmonary disease. Electronically Signed   By: Lupita Raider M.D.   On: 02/04/2021 17:22   CT Angio Chest Pulmonary Embolism (PE) W or WO Contrast  Result Date: 02/04/2021 CLINICAL DATA:  Shortness of breath, chest heaviness EXAM: CT ANGIOGRAPHY CHEST WITH CONTRAST TECHNIQUE: Multidetector CT imaging of the chest was performed using the standard protocol during bolus administration of intravenous contrast. Multiplanar CT image reconstructions and MIPs were obtained to evaluate the vascular anatomy. CONTRAST:  OMNIPAQUE IOHEXOL 350 MG/ML SOLN COMPARISON:  Radiograph 02/04/2021 FINDINGS: Cardiovascular: Borderline opacification of pulmonary arteries with a central pulmonary artery contrast bolus of 178 Hounsfield units. May limit detection of smaller segmental and subsegmental pulmonary artery emboli. No central or lobar filling defects are identified. Pulmonary trunk appears borderline dilated though is likely accentuated by cardiac pulsation when viewed on coronal reconstruction. Left and right main pulmonary arteries remain a more normal caliber. Normal heart size. No pericardial effusion. Motion artifact limits evaluation of the aortic root and ascending aorta. No gross acute aortic abnormality. The aorta is normal caliber. Shared origin of the brachiocephalic and left common carotid arteries.  No major venous abnormality or significant venous reflux. Mediastinum/Nodes: Fatty stippling in the anterior mediastinum may reflect thymic remnant in a patient of this age. No mediastinal fluid or gas. Normal thyroid gland and thoracic inlet. No acute abnormality of the trachea or esophagus. No worrisome mediastinal, hilar or axillary adenopathy. Lungs/Pleura: Airways patent. No consolidation, features of edema, pneumothorax, or effusion. No suspicious pulmonary nodules or masses. Upper Abdomen: Diffuse hepatic hypoattenuation compatible with hepatic steatosis. Sparing along gallbladder fossa. No acute abnormalities present in the visualized portions of the upper abdomen. Musculoskeletal: No acute osseous abnormality or suspicious osseous lesion. Review of the MIP images confirms the above findings. IMPRESSION: Suboptimal contrast opacification of the pulmonary arteries may limit detection of small segmental and subsegmental filling defects. No large central or lobar filling defects are identified. Accentuation of the pulmonary trunk likely due to cardiac pulsation artifact. No clear central pulmonary arterial enlargement accounting for this artifact. No other acute intrathoracic process. Diffuse hepatic hypoattenuation, most often reflective of hepatic steatosis. Electronically Signed   By: Kreg Shropshire M.D.   On: 02/04/2021 21:59    Cardiac Studies     Patient Profile     20 y.o. male who presents with CP and NSTEMI  Assessment & Plan    1  NSTEMI   Pt with CP Wed which resolved  Then CP recurred on Friday    Severe   Presented finally to [facility] ED late Friday.  EKG with no ST elevation   Mild ST depression   Troponins:  236 / 426 / 11,542  /  18, 403.  Echo done  On my review  Without Definity: APex had prominent trabeculations (initial thought of possible apical hypertrophy)  RV function also appears a loittle down.   With Definity use highlighted akinesis of the distal portion of the LV  LVEF  approximantly 40% (see image 67)  Pt currrently on heparin.  Also on colchicine (which was continued since yesterday)   I do not think this represents myocarditis   Although focal myocarditis could happen the pt's clinical hx does not support this,     Can continue colchicine for now as, except for diarrhea has  low risk  Would recomm L heart cath tomorrow to define anatomy   Risk and benefits have already been discussed with pt and family   Agree to proceed Continue heparin , ASA, lipitor   Add low dose b blocker   2  Rhythm   Mainly SR    HE has had a few short bursts of rapid heart rates 130s to 150   Short lived   Probably ST  Follow     Will add low dose B blocker today decrease O2 demand  3  Lipds  LDL 122, HDL 26  Trig 118    On statin now  4  Blood pressure   BP has been OK here  Follow   5  Obesity   Again, reviewed recomm of sugar (36 g added per day) for male     Pt with fatty changes liver on CT     A1C is pending    For questions or updates, please contact [facility] Please consult [email or web address] for contact info under        Signed, Dietrich Pates, MD  02/06/2021, 8:52 AM

## 2021-02-07 ENCOUNTER — Encounter
Admission: EM | Disposition: A | Discharge status: Home / Self Care | Payer: Self-pay | Source: Home / Self Care | Attending: Internal Medicine

## 2021-02-07 ENCOUNTER — Encounter: Payer: Self-pay | Admitting: Internal Medicine

## 2021-02-07 DIAGNOSIS — I255 Ischemic cardiomyopathy: Secondary | ICD-10-CM

## 2021-02-07 DIAGNOSIS — E8881 Metabolic syndrome: Secondary | ICD-10-CM

## 2021-02-07 DIAGNOSIS — E876 Hypokalemia: Secondary | ICD-10-CM

## 2021-02-07 DIAGNOSIS — I2510 Atherosclerotic heart disease of native coronary artery without angina pectoris: Secondary | ICD-10-CM

## 2021-02-07 DIAGNOSIS — K760 Fatty (change of) liver, not elsewhere classified: Secondary | ICD-10-CM

## 2021-02-07 HISTORY — PX: LEFT HEART CATH AND CORONARY ANGIOGRAPHY: CATH118249

## 2021-02-07 LAB — HEMOGLOBIN A1C
Hgb A1c MFr Bld: 6.3 % — ABNORMAL HIGH (ref 4.8–5.6)
Mean Plasma Glucose: 134 mg/dL

## 2021-02-07 LAB — CBC
HCT: 31.1 % — ABNORMAL LOW (ref 39.0–52.0)
Hemoglobin: 11.3 g/dL — ABNORMAL LOW (ref 13.0–17.0)
MCH: 37.2 pg — ABNORMAL HIGH (ref 26.0–34.0)
MCHC: 36.3 g/dL — ABNORMAL HIGH (ref 30.0–36.0)
MCV: 102.3 fL — ABNORMAL HIGH (ref 80.0–100.0)
Platelets: 301 10*3/uL (ref 150–400)
RBC: 3.04 MIL/uL — ABNORMAL LOW (ref 4.22–5.81)
RDW: 18.2 % — ABNORMAL HIGH (ref 11.5–15.5)
WBC: 8.7 10*3/uL (ref 4.0–10.5)
nRBC: 0 % (ref 0.0–0.2)

## 2021-02-07 LAB — HEPARIN LEVEL (UNFRACTIONATED): Heparin Unfractionated: 0.32 IU/mL (ref 0.30–0.70)

## 2021-02-07 LAB — BASIC METABOLIC PANEL
Anion gap: 9 (ref 5–15)
BUN: 11 mg/dL (ref 6–20)
CO2: 26 mmol/L (ref 22–32)
Calcium: 9.5 mg/dL (ref 8.9–10.3)
Chloride: 102 mmol/L (ref 98–111)
Creatinine, Ser: 0.76 mg/dL (ref 0.61–1.24)
GFR, Estimated: >60 mL/min (ref >60)
Glucose, Bld: 110 mg/dL — ABNORMAL HIGH (ref 70–99)
Potassium: 3.2 mmol/L — ABNORMAL LOW (ref 3.5–5.1)
Sodium: 137 mmol/L (ref 135–145)

## 2021-02-07 SURGERY — LEFT HEART CATH AND CORONARY ANGIOGRAPHY
Anesthesia: Moderate Sedation

## 2021-02-07 MED ORDER — CLOPIDOGREL BISULFATE 75 MG PO TABS
ORAL_TABLET | ORAL | Status: AC
  Filled 2021-02-07: qty 4

## 2021-02-07 MED ORDER — SODIUM CHLORIDE 0.9 % IV SOLN: INTRAVENOUS | Status: AC

## 2021-02-07 MED ORDER — SODIUM CHLORIDE 0.9 % IV SOLN: 250.0000 mL | INTRAVENOUS | Status: DC | PRN

## 2021-02-07 MED ORDER — HEPARIN SODIUM (PORCINE) 1000 UNIT/ML IJ SOLN
INTRAMUSCULAR | Status: AC
  Filled 2021-02-07: qty 1

## 2021-02-07 MED ORDER — METOPROLOL SUCCINATE ER 25 MG PO TB24
25.0000 mg | ORAL_TABLET | Freq: Every day | ORAL | Status: DC
  Administered 2021-02-07 – 2021-02-08 (×2): 25 mg via ORAL
  Filled 2021-02-07 (×2): qty 1

## 2021-02-07 MED ORDER — VERAPAMIL HCL 2.5 MG/ML IV SOLN
INTRAVENOUS | Status: AC
  Filled 2021-02-07: qty 2

## 2021-02-07 MED ORDER — ENOXAPARIN SODIUM 60 MG/0.6ML IJ SOSY
0.5000 mg/kg | PREFILLED_SYRINGE | INTRAMUSCULAR | Status: DC
  Filled 2021-02-07: qty 0.6

## 2021-02-07 MED ORDER — HEPARIN (PORCINE) IN NACL 1000-0.9 UT/500ML-% IV SOLN
INTRAVENOUS | Status: DC | PRN
  Administered 2021-02-07: 1000 mL

## 2021-02-07 MED ORDER — ENOXAPARIN SODIUM 40 MG/0.4ML IJ SOSY: 40.0000 mg | PREFILLED_SYRINGE | INTRAMUSCULAR | Status: DC

## 2021-02-07 MED ORDER — ACETAMINOPHEN 325 MG PO TABS
ORAL_TABLET | ORAL | Status: AC
  Filled 2021-02-07: qty 1

## 2021-02-07 MED ORDER — POTASSIUM CHLORIDE CRYS ER 20 MEQ PO TBCR
40.0000 meq | EXTENDED_RELEASE_TABLET | Freq: Once | ORAL | Status: AC
  Administered 2021-02-07: 40 meq via ORAL
  Filled 2021-02-07: qty 2

## 2021-02-07 MED ORDER — SODIUM CHLORIDE 0.9% FLUSH: 3.0000 mL | INTRAVENOUS | Status: DC | PRN

## 2021-02-07 MED ORDER — LIDOCAINE HCL (PF) 1 % IJ SOLN
INTRAMUSCULAR | Status: DC | PRN
  Administered 2021-02-07: 2 mL

## 2021-02-07 MED ORDER — CLOPIDOGREL BISULFATE 75 MG PO TABS
75.0000 mg | ORAL_TABLET | Freq: Every day | ORAL | Status: DC
  Administered 2021-02-08: 75 mg via ORAL
  Filled 2021-02-07: qty 1

## 2021-02-07 MED ORDER — SODIUM CHLORIDE 0.9% FLUSH
3.0000 mL | Freq: Two times a day (BID) | INTRAVENOUS | Status: DC
  Administered 2021-02-07: 3 mL via INTRAVENOUS

## 2021-02-07 MED ORDER — FENTANYL CITRATE (PF) 100 MCG/2ML IJ SOLN
INTRAMUSCULAR | Status: DC | PRN
  Administered 2021-02-07: 25 ug via INTRAVENOUS

## 2021-02-07 MED ORDER — VERAPAMIL HCL 2.5 MG/ML IV SOLN
INTRAVENOUS | Status: DC | PRN
  Administered 2021-02-07: 2.5 mg via INTRA_ARTERIAL

## 2021-02-07 MED ORDER — FENTANYL CITRATE (PF) 100 MCG/2ML IJ SOLN
INTRAMUSCULAR | Status: AC
  Filled 2021-02-07: qty 2

## 2021-02-07 MED ORDER — HEPARIN (PORCINE) IN NACL 1000-0.9 UT/500ML-% IV SOLN
INTRAVENOUS | Status: AC
  Filled 2021-02-07: qty 1000

## 2021-02-07 MED ORDER — MIDAZOLAM HCL 2 MG/2ML IJ SOLN
INTRAMUSCULAR | Status: DC | PRN
  Administered 2021-02-07: 1 mg via INTRAVENOUS

## 2021-02-07 MED ORDER — LABETALOL HCL 5 MG/ML IV SOLN: 10.0000 mg | INTRAVENOUS | Status: AC | PRN

## 2021-02-07 MED ORDER — MIDAZOLAM HCL 2 MG/2ML IJ SOLN
INTRAMUSCULAR | Status: AC
  Filled 2021-02-07: qty 2

## 2021-02-07 MED ORDER — CLOPIDOGREL BISULFATE 75 MG PO TABS
ORAL_TABLET | ORAL | Status: DC | PRN
  Administered 2021-02-07: 300 mg via ORAL

## 2021-02-07 MED ORDER — LIDOCAINE HCL (PF) 1 % IJ SOLN
INTRAMUSCULAR | Status: AC
  Filled 2021-02-07: qty 30

## 2021-02-07 MED ORDER — HYDRALAZINE HCL 20 MG/ML IJ SOLN: 10.0000 mg | INTRAMUSCULAR | Status: AC | PRN

## 2021-02-07 MED ORDER — IOHEXOL 300 MG/ML  SOLN
INTRAMUSCULAR | Status: DC | PRN
  Administered 2021-02-07: 33 mL

## 2021-02-07 MED ORDER — HEPARIN SODIUM (PORCINE) 1000 UNIT/ML IJ SOLN
INTRAMUSCULAR | Status: DC | PRN
  Administered 2021-02-07: 5000 [IU] via INTRAVENOUS

## 2021-02-07 SURGICAL SUPPLY — 13 items
CATH 5F 110X4 TIG (CATHETERS) ×1 IMPLANT
CATH INFINITI 5 FR JL3.5 (CATHETERS) ×1 IMPLANT
CATH LAUNCHER 6FR EBU 3 (CATHETERS) ×1 IMPLANT
DEVICE RAD TR BAND REGULAR (VASCULAR PRODUCTS) ×1 IMPLANT
DRAPE BRACHIAL (DRAPES) ×1 IMPLANT
GLIDESHEATH SLEND SS 6F .021 (SHEATH) ×1 IMPLANT
GUIDEWIRE INQWIRE 1.5J.035X260 (WIRE) IMPLANT
INQWIRE 1.5J .035X260CM (WIRE) ×2
KIT ENCORE 26 ADVANTAGE (KITS) IMPLANT
PACK CARDIAC CATH (CUSTOM PROCEDURE TRAY) ×2 IMPLANT
PROTECTION STATION PRESSURIZED (MISCELLANEOUS) ×2
SET ATX SIMPLICITY (MISCELLANEOUS) ×1 IMPLANT
STATION PROTECTION PRESSURIZED (MISCELLANEOUS) IMPLANT

## 2021-02-07 NOTE — Consult Note (Signed)
ANTICOAGULATION CONSULT NOTE - Initial Consult  Pharmacy Consult for Heparin  Indication: chest pain/ACS  No Known Allergies  Patient Measurements: Height: 6' (182.9 cm) Weight: 119.4 kg (263 lb 4.8 oz) IBW/kg (Calculated) : 77.6 Heparin Dosing Weight: 106 kg  Vital Signs: Temp: 98.6 F (37 C) (07/25 0350) Temp Source: Oral (07/25 0350) BP: 108/62 (07/25 0350) Pulse Rate: 74 (07/25 0350)  Labs: Recent Labs    02/04/21 1652 02/04/21 1844 02/05/21 0706 02/05/21 1006 02/05/21 1700 02/05/21 2232 02/05/21 2342 02/06/21 0553 02/06/21 1158 02/07/21 0459  HGB 11.7*  --  10.9*  --   --   --   --  11.5*  --  11.3*  HCT 32.6*  --  30.6*  --   --   --   --  32.4*  --  31.1*  PLT 422*  --  336  --   --   --   --  355  --  301  APTT  --   --   --  30  --   --   --   --   --   --   LABPROT  --   --   --  13.4  --   --   --   --   --   --   INR  --   --   --  1.0  --   --   --   --   --   --   HEPARINUNFRC  --   --   --   --    < >  --  0.34 0.34  --  0.32  CREATININE 1.02  --   --   --   --   --   --   --   --  0.76  TROPONINIHS 236*   < > 11,542*  --   --  18,403*  --   --  12,675*  --    < > = values in this interval not displayed.     Estimated Creatinine Clearance: 196.5 mL/min (by C-G formula based on SCr of 0.76 mg/dL).   Medical History: History reviewed. No pertinent past medical history.  Medications:  No prior PTA anticoagulation medications  Assessment: Pharmacy has been consulted to initiate heparin infusion in 19yo patient with 2 days of chest discomfort with peak troponin level of 11,000. Patient with no history of anticoagulation PTA medication use. Of note, did receive enoxaparin dose@2315  on 02/04/21. After discussion with Cards, since enoxaparin dose was for DVT ppx and not a treatment dose as well as given the patient's weight, a bolus heparin dose will be ordered to accompany the continuous infusion.  Date/Time HL Comment 7/23 @1700  0.17 Subtherapeutic -  confirmed with nurse no issues with heparin line/infusion 7/23 @2342    0.34    Therapeutic  7/24 @0553    0.34    Therapeutic X 2  7/25 @0459    0.32    Therapeutic X 3   Goal of Therapy:  Heparin level 0.3-0.7 units/ml Monitor platelets by anticoagulation protocol: Yes   Plan:  7/25:  HL @ 0459 = 0.32, therapeutic X 3  Will continue pt on current rate and recheck HL on 7/26 with AM labs.   Rayshawn Maney D 02/07/2021,5:49 AM

## 2021-02-07 NOTE — Progress Notes (Signed)
Progress Note  Patient Name: Peter Monroe Date of Encounter: 02/07/2021  [facility] Cardiologist: New  Subjective   Feels well without chest pain, shortness of breath, palpitations, or edema.  Inpatient Medications    Scheduled Meds:  acetaminophen       aspirin EC  81 mg Oral Daily   atorvastatin  80 mg Oral Daily   [START ON 02/08/2021] clopidogrel  75 mg Oral Q breakfast   colchicine  0.6 mg Oral BID   cyanocobalamin  1,000 mcg Intramuscular Daily   [START ON 02/08/2021] enoxaparin (LOVENOX) injection  0.5 mg/kg Subcutaneous Q24H   metoprolol succinate  25 mg Oral Daily   sodium chloride flush  3 mL Intravenous Q12H   Continuous Infusions:  sodium chloride     sodium chloride     PRN Meds: sodium chloride, acetaminophen, alum & mag hydroxide-simeth, hydrALAZINE, labetalol, ondansetron (ZOFRAN) IV, sodium chloride flush   Vital Signs    Vitals:   02/07/21 1030 02/07/21 1045 02/07/21 1100 02/07/21 1213  BP: (!) 132/49 131/81  124/70  Pulse: 76 84 81 72  Resp: 17 12 12 20   Temp:    98.1 F (36.7 C)  TempSrc:      SpO2: 100% 100% 100% 100%  Weight:      Height:        Intake/Output Summary (Last 24 hours) at 02/07/2021 1417 Last data filed at 02/07/2021 0630 Gross per 24 hour  Intake 1191.12 ml  Output --  Net 1191.12 ml   Last 3 Weights 02/06/2021 02/05/2021 02/04/2021  Weight (lbs) 263 lb 4.8 oz 263 lb 4.8 oz 280 lb  Weight (kg) 119.432 kg 119.432 kg 127.007 kg      Telemetry    Sinus rhythm - Personally Reviewed  ECG    No new tracing.  Physical Exam   GEN: No acute distress.   Neck: No JVD Cardiac: RRR, no murmurs, rubs, or gallops.  Respiratory: Clear to auscultation bilaterally. GI: Soft, nontender, non-distended  MS: No edema; No deformity. Neuro:  Nonfocal  Psych: Normal affect   Labs    High Sensitivity Troponin:   Recent Labs  Lab 02/04/21 1652 02/04/21 1844 02/05/21 0706 02/05/21 2232 02/06/21 1158  TROPONINIHS 236*  426* 11,542* 18,403* 12,675*      Chemistry Recent Labs  Lab 02/04/21 1652 02/07/21 0459  NA 139 137  K 3.8 3.2*  CL 106 102  CO2 23 26  GLUCOSE 160* 110*  BUN 14 11  CREATININE 1.02 0.76  CALCIUM 9.8 9.5  GFRNONAA >60 >60  ANIONGAP 10 9     Hematology Recent Labs  Lab 02/05/21 0706 02/06/21 0553 02/07/21 0459  WBC 8.5 8.9 8.7  RBC 2.97* 3.22* 3.04*  HGB 10.9* 11.5* 11.3*  HCT 30.6* 32.4* 31.1*  MCV 103.0* 100.6* 102.3*  MCH 36.7* 35.7* 37.2*  MCHC 35.6 35.5 36.3*  RDW 18.5* 17.8* 18.2*  PLT 336 355 301    BNPNo results for input(s): BNP, PROBNP in the last 168 hours.   DDimer No results for input(s): DDIMER in the last 168 hours.   Radiology    CARDIAC CATHETERIZATION  Result Date: 02/07/2021 Conclusions: Severe single-vessel coronary artery disease with occlusion of the proximal/mid LAD.  The mid/distal LAD fills via left to left and right to left collaterals. Minimal luminal irregularities noted in the RCA. Upper normal left ventricular filling pressure (LVEDP ~15 mmHg). Recommendations: Aggressive secondary prevention, including 12 months of dual antiplatelet therapy and long-term high-intensity statin therapy.  Consider viability and/or function study in a few weeks after the patient has recovered from acute MI to determine if there would be benefit from revascularization of LAD occlusion.  PCI not attempted today, as event began at least 72 hours ago with absence of symptoms for 48 hours and developing collaterals. Yvonne Kendall, MD [facility]    Cardiac Studies   TTE (02/05/2021):  1. Left ventricular ejection fraction, by estimation, is 50 to 55%. The  left ventricle has low normal function. The left ventricle has no regional  wall motion abnormalities. There is severe concentric left ventricular  hypertrophy. Left ventricular  diastolic parameters are consistent with Grade II diastolic dysfunction  (pseudonormalization).   2. Right ventricular  systolic function is normal. The right ventricular  size is normal. There is normal pulmonary artery systolic pressure.   3. The mitral valve is normal in structure. Mild to moderate mitral valve  regurgitation. No evidence of mitral stenosis.   4. The aortic valve is normal in structure. Aortic valve regurgitation is  trivial. Mild aortic valve sclerosis is present, with no evidence of  aortic valve stenosis.   5. The inferior vena cava is normal in size with greater than 50%  respiratory variability, suggesting right atrial pressure of 3 mmHg.   Patient Profile     20 y.o. male with no significant past medical history admitted with NSTEMI.  Assessment & Plan    NSTEMI: Patient without further chest pain, having been asymptomatic for at least 48 hours now.  He has completed at least 48 hours of IV heparin.  Catheterization today showed occlusion of the proximal/mid LAD with collateral filling of the distal vessel.  Based on his only mildly reduced LVEF with wall motion abnormality near the LV apex as well as troponin that peaked around 18,000, I suspect that this was an acute on chronic subtotal occlusion of the LAD, though this would be quite unusual in such a young man. Patient loaded with clopidogrel in the Cath Lab.  Plan to continue aspirin and clopidogrel 75 mg daily for 12 months. Continue atorvastatin 80 mg daily. Consider viability study and or functional assessment with exercise tolerance test as an outpatient to determine if PCI to occluded LAD would offer benefit. Transition 3 times daily metoprolol to metoprolol succinate 25 mg daily.  Ischemic cardiomyopathy: LVEF low normal to mildly reduced by echo this admission with hypokinesis of the apical anterior and apical septal myocardium as well as the true apex on my personal review. Transition to metoprolol succinate 25 mg daily. Consider addition of low-dose ACE inhibitor/ARB prior to discharge if blood pressure and renal  function tolerate. Maintain net even fluid balance.  Hepatic steatosis: Incidentally noted on CT imaging. Encourage lifestyle modifications. Further work-up/management per IM.  Disposition: Recommend monitoring the patient as an inpatient 1 more day.  Hopefully, he can be discharged home tomorrow with close outpatient follow-up.  For questions or updates, please contact [facility] Please consult [email or web address] for contact info under [facility].     Signed, Yvonne Kendall, MD  02/07/2021, 2:17 PM

## 2021-02-07 NOTE — Brief Op Note (Signed)
BRIEF CARDIAC CATHETERIZATION NOTE  DATE: 02/07/2021  TIME: 9:33 AM  PATIENT:  Peter Monroe  19 y.o. male  PRE-OPERATIVE DIAGNOSIS:  NSTEMI  POST-OPERATIVE DIAGNOSIS:  NSTEMI  PROCEDURE:  Procedure(s): LEFT HEART CATH AND CORONARY ANGIOGRAPHY (N/A)  SURGEON:  Surgeon(s) and Role:    * Teshia Mahone, MD - Primary  FINDINGS: Severe single vessel CAD with occlusion of proximal/mid LAD with left-to-left and right-to-left collaterals. Upper normal LVEDP.  RECOMMENDATIONS: Aggressive medical therapy. Consider functional assessment as outpatient to help guide role for CTO PCI in the future.  Yvonne Kendall, MD [facility]

## 2021-02-07 NOTE — Consult Note (Signed)
   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  Grade 2 diastolic dysfunction.  Normal right ventricular systolic function.  Mild to moderate mitral regurgitation.  He presented to the emergency room with complaints of chest discomfort.  Comorbidities  Coronary artery disease Hypertension since age 20, not on medication. Obesity  Cardiac catheterization revealed 100% occluded distal LAD with collaterals.  Medications:  Aspirin 81 mg daily Atorvastatin 80 mg daily Metoprolol succinate 25 mg daily Plavix 75 mg daily   Labs:  Sodium 137, potassium 3.2, chloride 102, CO2 26, BUN 11, creatinine 0.76, hemoglobin A1c 6.3, troponin peaked greater than 1800 119 kg Intake 1624 mL Output not documented Weight not documented    Assessment:  General-he is awake and alert lying in bed in no acute distress.  HEENT-pupils are equal, normocephalic, no JVD.  Cardiac-heart tones of regular rate and rhythm.  Chest-breath sounds are clear to posterior auscultation  Abdomen-rounded soft nontender  Musculoskeletal-there is no lower extremity edema noted   Psych-is pleasant and appropriate makes good eye contact.  Neurologic-speech is clear moves all extremities without difficulty.    Initial meeting with patient today, his mother is at the bedside.  Discussed diastolic heart failure, he states that the cardiologist had also explained that to him.  Discussed the importance of low-sodium diet, fluid restriction, daily weights and what to report to physician.  Discussed restaurant eating, patient states that he does eat a lot of fast food but that would not be hard for him to change that habit.  States that he does not currently have a scale but that he would be able to get 1.  Also talked about monitoring his blood pressures and recording, his mother states that she has a cuff that she could give up to him.  Discussed follow-up with cardiac rehab and also with the  outpatient heart failure clinic.  He has been given an appointment for August 8 at 2 PM.  Will continue to follow along.   Tresa Endo RN [facility]

## 2021-02-07 NOTE — Interval H&P Note (Signed)
History and Physical Interval Note:  02/07/2021 8:40 AM  Peter Monroe  has presented today for surgery, with the diagnosis of NSTEMI.  The various methods of treatment have been discussed with the patient and family. After consideration of risks, benefits and other options for treatment, the patient has consented to  Procedure(s): LEFT HEART CATH AND CORONARY ANGIOGRAPHY (N/A) as a surgical intervention.  The patient's history has been reviewed, patient examined, no change in status, stable for surgery.  I have reviewed the patient's chart and labs.  Questions were answered to the patient's satisfaction.  Cath Lab Visit (complete for each Cath Lab visit)  Clinical Evaluation Leading to the Procedure:   ACS: Yes.    Non-ACS:  N/A  Elzada Pytel

## 2021-02-07 NOTE — Progress Notes (Signed)
Patient ID: Peter Monroe, male   DOB: October 15, 2000, 20 y.o.   MRN: [id number] [facility] PROGRESS NOTE  ZAKIAH BECKERMAN WUJ:[id number] DOB: 2000-12-24 DOA: 02/04/2021 PCP: Oswaldo Conroy, MD  HPI/Subjective: Patient feeling fine after procedure.  No chest pain or shortness of breath.  He admitted with chest pain and found to have NSTEMI.  Objective: Vitals:   02/07/21 1213 02/07/21 1506  BP: 124/70 117/71  Pulse: 72 63  Resp: 20 20  Temp: 98.1 F (36.7 C) 98.1 F (36.7 C)  SpO2: 100% 100%    Intake/Output Summary (Last 24 hours) at 02/07/2021 1551 Last data filed at 02/07/2021 0630 Gross per 24 hour  Intake 1191.12 ml  Output --  Net 1191.12 ml   Filed Weights   02/04/21 1650 02/05/21 1534 02/06/21 1256  Weight: 127 kg 119.4 kg 119.4 kg    ROS: Review of Systems  Respiratory:  Negative for cough and shortness of breath.   Cardiovascular:  Negative for chest pain.  Gastrointestinal:  Negative for abdominal pain, nausea and vomiting.  Exam: Physical Exam HENT:     Head: Normocephalic.     Mouth/Throat:     Pharynx: No oropharyngeal exudate.  Eyes:     General: Lids are normal.     Conjunctiva/sclera: Conjunctivae normal.     Pupils: Pupils are equal, round, and reactive to light.  Cardiovascular:     Rate and Rhythm: Normal rate and regular rhythm.     Heart sounds: Normal heart sounds, S1 normal and S2 normal.  Pulmonary:     Breath sounds: Normal breath sounds. No decreased breath sounds, wheezing, rhonchi or rales.  Abdominal:     Palpations: Abdomen is soft.     Tenderness: There is no abdominal tenderness.  Musculoskeletal:     Right ankle: No swelling.     Left ankle: No swelling.  Skin:    General: Skin is warm.     Findings: No rash.  Neurological:     Mental Status: He is alert and oriented to person, place, and time.     Data Reviewed: Basic Metabolic Panel: Recent Labs  Lab 02/04/21 1652 02/07/21 0459  NA 139 137  K 3.8 3.2*   CL 106 102  CO2 23 26  GLUCOSE 160* 110*  BUN 14 11  CREATININE 1.02 0.76  CALCIUM 9.8 9.5    CBC: Recent Labs  Lab 02/04/21 1652 02/05/21 0706 02/06/21 0553 02/07/21 0459  WBC 15.2* 8.5 8.9 8.7  HGB 11.7* 10.9* 11.5* 11.3*  HCT 32.6* 30.6* 32.4* 31.1*  MCV 102.2* 103.0* 100.6* 102.3*  PLT 422* 336 355 301     Recent Results (from the past 240 hour(s))  Resp Panel by RT-PCR (Flu A&B, Covid) Nasopharyngeal Swab     Status: None   Collection Time: 02/04/21  6:43 PM   Specimen: Nasopharyngeal Swab; Nasopharyngeal(NP) swabs in vial transport medium  Result Value Ref Range Status   SARS Coronavirus 2 by RT PCR NEGATIVE NEGATIVE Final    Comment: (NOTE) SARS-CoV-2 target nucleic acids are NOT DETECTED.  The SARS-CoV-2 RNA is generally detectable in upper respiratory specimens during the acute phase of infection. The lowest concentration of SARS-CoV-2 viral copies this assay can detect is 138 copies/mL. A negative result does not preclude SARS-Cov-2 infection and should not be used as the sole basis for treatment or other patient management decisions. A negative result may occur with  improper specimen collection/handling, submission of specimen other than nasopharyngeal swab, presence of  viral mutation(s) within the areas targeted by this assay, and inadequate number of viral copies(<138 copies/mL). A negative result must be combined with clinical observations, patient history, and epidemiological information. The expected result is Negative.  Fact Sheet for Patients:  BloggerCourse.com  Fact Sheet for Healthcare Providers:  SeriousBroker.it  This test is no t yet approved or cleared by the [place] FDA and  has been authorized for detection and/or diagnosis of SARS-CoV-2 by FDA under an Emergency Use Authorization (EUA). This EUA will remain  in effect (meaning this test can be used) for the duration of  the COVID-19 declaration under Section 564(b)(1) of the Act, 21 U.S.C.section 360bbb-3(b)(1), unless the authorization is terminated  or revoked sooner.       Influenza A by PCR NEGATIVE NEGATIVE Final   Influenza B by PCR NEGATIVE NEGATIVE Final    Comment: (NOTE) The Xpert Xpress SARS-CoV-2/FLU/RSV plus assay is intended as an aid in the diagnosis of influenza from Nasopharyngeal swab specimens and should not be used as a sole basis for treatment. Nasal washings and aspirates are unacceptable for Xpert Xpress SARS-CoV-2/FLU/RSV testing.  Fact Sheet for Patients: BloggerCourse.com  Fact Sheet for Healthcare Providers: SeriousBroker.it  This test is not yet approved or cleared by the [place] FDA and has been authorized for detection and/or diagnosis of SARS-CoV-2 by FDA under an Emergency Use Authorization (EUA). This EUA will remain in effect (meaning this test can be used) for the duration of the COVID-19 declaration under Section 564(b)(1) of the Act, 21 U.S.C. section 360bbb-3(b)(1), unless the authorization is terminated or revoked.  Performed at [facility], [address] [place], [place] [address]      Studies: CARDIAC CATHETERIZATION  Result Date: 02/07/2021 Conclusions: Severe single-vessel coronary artery disease with occlusion of the proximal/mid LAD.  The mid/distal LAD fills via left to left and right to left collaterals. Minimal luminal irregularities noted in the RCA. Upper normal left ventricular filling pressure (LVEDP ~15 mmHg). Recommendations: Aggressive secondary prevention, including 12 months of dual antiplatelet therapy and long-term high-intensity statin therapy. Consider viability and/or function study in a few weeks after the patient has recovered from acute MI to determine if there would be benefit from revascularization of LAD occlusion.  PCI not attempted today, as event began at  least 72 hours ago with absence of symptoms for 48 hours and developing collaterals. Yvonne Kendall, MD [facility]    Scheduled Meds:  acetaminophen       aspirin EC  81 mg Oral Daily   atorvastatin  80 mg Oral Daily   [START ON 02/08/2021] clopidogrel  75 mg Oral Q breakfast   cyanocobalamin  1,000 mcg Intramuscular Daily   [START ON 02/08/2021] enoxaparin (LOVENOX) injection  0.5 mg/kg Subcutaneous Q24H   metoprolol succinate  25 mg Oral Daily   sodium chloride flush  3 mL Intravenous Q12H   Continuous Infusions:  sodium chloride     sodium chloride      Assessment/Plan:  NSTEMI with troponins peaking at 18,403.  Patient initially on heparin drip now off.  On aspirin metoprolol Lipitor.  Plavix load added.  Cardiac catheterization showed severe single-vessel coronary artery disease proximal mid LAD with collaterals.  Cardiology recommended aggressive medical management.  LDL 122. Metabolic syndrome with impaired fasting glucose, obesity, hyperlipidemia.  Hemoglobin A1c 6.3.  Diet exercise and weight loss discussed. Hypokalemia.  Potassium supplementation Vitamin B12 deficiency with macrocytic anemia B12 injections while here.  Code Status:     Code Status Orders  (From admission, onward)           Start     Ordered   02/04/21 2001  Full code  Continuous        02/04/21 2003           Code Status History     This patient has a current code status but no historical code status.      Family Communication: Spoke with patient's father at the bedside Disposition Plan: Status is: Inpatient  Dispo: The patient is from: Home              Anticipated d/c is to: Home              Patient currently cardiology recommends watching overnight.   Difficult to place patient.  No.   Consultants: Cardiology  Time spent: 26 minutes  [employer]

## 2021-02-07 NOTE — Progress Notes (Signed)
PHARMACIST - PHYSICIAN COMMUNICATION  CONCERNING:  Enoxaparin (Lovenox) for DVT Prophylaxis    RECOMMENDATION: Patient was prescribed enoxaprin 40mg  q24 hours for VTE prophylaxis.   Filed Weights   02/04/21 1650 02/05/21 1534 02/06/21 1256  Weight: 127 kg (280 lb) 119.4 kg (263 lb 4.8 oz) 119.4 kg (263 lb 4.8 oz)    Body mass index is 35.71 kg/m.  Estimated Creatinine Clearance: 196.5 mL/min (by C-G formula based on SCr of 0.76 mg/dL).   Based on [facility] policy patient is candidate for enoxaparin 0.5mg /kg TBW SQ every 24 hours based on BMI being >30.   DESCRIPTION: Pharmacy has adjusted enoxaparin dose per [facility] policy.  Patient is now receiving enoxaparin 60 mg every 24 hours    [facility], PharmD Pharmacy Resident  02/07/2021 12:03 PM

## 2021-02-08 ENCOUNTER — Other Ambulatory Visit: Payer: Self-pay | Admitting: Internal Medicine

## 2021-02-08 ENCOUNTER — Other Ambulatory Visit: Payer: Self-pay | Admitting: *Deleted

## 2021-02-08 DIAGNOSIS — I214 Non-ST elevation (NSTEMI) myocardial infarction: Secondary | ICD-10-CM

## 2021-02-08 LAB — BASIC METABOLIC PANEL
Anion gap: 9 (ref 5–15)
BUN: 10 mg/dL (ref 6–20)
CO2: 27 mmol/L (ref 22–32)
Calcium: 9.6 mg/dL (ref 8.9–10.3)
Chloride: 101 mmol/L (ref 98–111)
Creatinine, Ser: 0.95 mg/dL (ref 0.61–1.24)
GFR, Estimated: >60 mL/min (ref >60)
Glucose, Bld: 105 mg/dL — ABNORMAL HIGH (ref 70–99)
Potassium: 3.9 mmol/L (ref 3.5–5.1)
Sodium: 137 mmol/L (ref 135–145)

## 2021-02-08 MED ORDER — CYANOCOBALAMIN 1000 MCG PO TABS: 1000.0000 ug | ORAL_TABLET | Freq: Every day | ORAL | 0 refills | Status: AC

## 2021-02-08 MED ORDER — METOPROLOL SUCCINATE ER 25 MG PO TB24: 25.0000 mg | ORAL_TABLET | Freq: Every day | ORAL | 0 refills | Status: DC

## 2021-02-08 MED ORDER — ASPIRIN 81 MG PO TBEC: 81.0000 mg | DELAYED_RELEASE_TABLET | Freq: Every day | ORAL | 0 refills | Status: AC

## 2021-02-08 MED ORDER — LOSARTAN POTASSIUM 25 MG PO TABS: 12.5000 mg | ORAL_TABLET | Freq: Every day | ORAL | 0 refills | Status: DC

## 2021-02-08 MED ORDER — CLOPIDOGREL BISULFATE 75 MG PO TABS: 75.0000 mg | ORAL_TABLET | Freq: Every day | ORAL | 0 refills | Status: DC

## 2021-02-08 MED ORDER — LOSARTAN POTASSIUM 25 MG PO TABS
12.5000 mg | ORAL_TABLET | Freq: Every day | ORAL | Status: DC
  Administered 2021-02-08: 12.5 mg via ORAL
  Filled 2021-02-08: qty 1

## 2021-02-08 MED ORDER — VITAMIN B-12 1000 MCG PO TABS
1000.0000 ug | ORAL_TABLET | Freq: Every day | ORAL | Status: DC
  Administered 2021-02-08: 1000 ug via ORAL
  Filled 2021-02-08: qty 1

## 2021-02-08 MED ORDER — ATORVASTATIN CALCIUM 80 MG PO TABS: 80.0000 mg | ORAL_TABLET | Freq: Every day | ORAL | 0 refills | Status: DC

## 2021-02-08 NOTE — Progress Notes (Signed)
Progress Note  Patient Name: Peter Monroe Date of Encounter: 02/08/2021  Primary Cardiologist: New to [facility] - consult by Dr. Tenny Craw, MD  Subjective   No chest pain, dyspnea, palpitations, or edema.  LHC on 7/25 showed severe single-vessel CAD with an occluded proximal/mid LAD with the mid/distal LAD filling via left to left and right to left collaterals.  Medical therapy recommended as outlined below.  Inpatient Medications    Scheduled Meds:  aspirin EC  81 mg Oral Daily   atorvastatin  80 mg Oral Daily   clopidogrel  75 mg Oral Q breakfast   cyanocobalamin  1,000 mcg Intramuscular Daily   enoxaparin (LOVENOX) injection  0.5 mg/kg Subcutaneous Q24H   losartan  12.5 mg Oral Daily   metoprolol succinate  25 mg Oral Daily   sodium chloride flush  3 mL Intravenous Q12H   vitamin B-12  1,000 mcg Oral Daily   Continuous Infusions:  sodium chloride     PRN Meds: sodium chloride, acetaminophen, alum & mag hydroxide-simeth, ondansetron (ZOFRAN) IV, sodium chloride flush   Vital Signs    Vitals:   02/07/21 1506 02/07/21 2019 02/08/21 0535 02/08/21 0809  BP: 117/71 125/68 119/73 114/70  Pulse: 63 70 76 63  Resp: 20 18 19 18   Temp: 98.1 F (36.7 C) 98.4 F (36.9 C)  98.5 F (36.9 C)  TempSrc: Oral     SpO2: 100% 100% 100% 100%  Weight:   117.9 kg   Height:        Intake/Output Summary (Last 24 hours) at 02/08/2021 1026 Last data filed at 02/07/2021 2300 Gross per 24 hour  Intake 240 ml  Output --  Net 240 ml   Filed Weights   02/05/21 1534 02/06/21 1256 02/08/21 0535  Weight: 119.4 kg 119.4 kg 117.9 kg    Telemetry    Sinus rhythm with sinus tachycardia with rates ranging from the 70s to 1 teens bpm - Personally Reviewed  ECG    No new tracings - Personally Reviewed  Physical Exam   GEN: No acute distress.   Neck: No JVD. Cardiac: RRR, no murmurs, rubs, or gallops.  Right radial cardiac cath site is without active bleeding, swelling, warmth, erythema,  or tenderness to palpation.  Radial pulse 2+. Respiratory: Clear to auscultation bilaterally.  GI: Soft, nontender, non-distended.   MS: No edema; No deformity. Neuro:  Alert and oriented x 3; Nonfocal.  Psych: Normal affect.  Labs    Chemistry Recent Labs  Lab 02/04/21 1652 02/07/21 0459 02/08/21 0730  NA 139 137 137  K 3.8 3.2* 3.9  CL 106 102 101  CO2 23 26 27   GLUCOSE 160* 110* 105*  BUN 14 11 10   CREATININE 1.02 0.76 0.95  CALCIUM 9.8 9.5 9.6  GFRNONAA >60 >60 >60  ANIONGAP 10 9 9      Hematology Recent Labs  Lab 02/05/21 0706 02/06/21 0553 02/07/21 0459  WBC 8.5 8.9 8.7  RBC 2.97* 3.22* 3.04*  HGB 10.9* 11.5* 11.3*  HCT 30.6* 32.4* 31.1*  MCV 103.0* 100.6* 102.3*  MCH 36.7* 35.7* 37.2*  MCHC 35.6 35.5 36.3*  RDW 18.5* 17.8* 18.2*  PLT 336 355 301    Cardiac EnzymesNo results for input(s): TROPONINI in the last 168 hours. No results for input(s): TROPIPOC in the last 168 hours.   BNPNo results for input(s): BNP, PROBNP in the last 168 hours.   DDimer No results for input(s): DDIMER in the last 168 hours.   Radiology  No new studies  Cardiac Studies   LHC 02/07/2021: Conclusions: Severe single-vessel coronary artery disease with occlusion of the proximal/mid LAD.  The mid/distal LAD fills via left to left and right to left collaterals. Minimal luminal irregularities noted in the RCA. Upper normal left ventricular filling pressure (LVEDP ~15 mmHg).   Recommendations: Aggressive secondary prevention, including 12 months of dual antiplatelet therapy and long-term high-intensity statin therapy. Consider viability and/or function study in a few weeks after the patient has recovered from acute MI to determine if there would be benefit from revascularization of LAD occlusion.  PCI not attempted today, as event began at least 72 hours ago with absence of symptoms for 48 hours and developing collaterals. __________  2D echo 02/05/2021:  1. Left  ventricular ejection fraction, by estimation, is 50 to 55%. The  left ventricle has low normal function. The left ventricle has no regional  wall motion abnormalities. There is severe concentric left ventricular  hypertrophy. Left ventricular  diastolic parameters are consistent with Grade II diastolic dysfunction  (pseudonormalization).   2. Right ventricular systolic function is normal. The right ventricular  size is normal. There is normal pulmonary artery systolic pressure.   3. The mitral valve is normal in structure. Mild to moderate mitral valve  regurgitation. No evidence of mitral stenosis.   4. The aortic valve is normal in structure. Aortic valve regurgitation is  trivial. Mild aortic valve sclerosis is present, with no evidence of  aortic valve stenosis.   5. The inferior vena cava is normal in size with greater than 50%  respiratory variability, suggesting right atrial pressure of 3 mmHg.  Patient Profile     20 y.o. male with no previously no significant past medical history who was admitted with an NSTEMI.    Assessment & Plan    1.  NSTEMI: -Chest pain-free -Completed 48 hours of IV heparin -LHC on 7/25 demonstrated an occluded proximal/mid LAD with left to left and right to left collaterals filling the distal portion of the vessel.  Given only mildly reduced LVEF with wall motion abnormality near the LV apex, as well as a high-sensitivity troponin that peaked around 18,000, it was suspected this was an acute on chronic subtotal occlusion of the LAD -Continue medical therapy with aspirin and clopidogrel for 12 months -Recommend viability study with cardiac MRI as well as possible stress testing in the outpatient setting to determine if PCI to the occluded LAD would offer clinical benefit -Continue metoprolol and atorvastatin -Post-cath instructions -Cardiac rehab -He plans to follow-up with Dr. Tenny Craw in [place], we will send scheduling a message  2.  ICM: -LVEF low  normal to mildly reduced by echo during this admission with hypokinesis of the apical anterior and apical septal myocardium -Continue Toprol-XL 25 mg daily -Add losartan 12.5 mg daily -In follow-up consider addition of MRA if renal function and BP allow  3.  Hepatic steatosis: -Incidentally noted on CT imaging -Lifestyle modification recommended -Follow-up with PCP   For questions or updates, please contact [facility] Please consult [email or web address] for contact info under Cardiology/STEMI.    Signed, Eula Listen, PA-C [facility] Pager: [phone] 02/08/2021, 10:26 AM

## 2021-02-08 NOTE — Discharge Summary (Signed)
[facility] [place] at [facility]   PATIENT NAME: Peter Monroe    MR#:  [id number]  DATE OF BIRTH:  01-Nov-2000  DATE OF ADMISSION:  02/04/2021 ADMITTING PHYSICIAN: Alford Highland, MD  DATE OF DISCHARGE: 02/08/2021  9:49 AM  PRIMARY CARE PHYSICIAN: Oswaldo Conroy, MD    ADMISSION DIAGNOSIS:  Elevated troponin [R77.8] NSTEMI (non-ST elevated myocardial infarction) (HCC) [I21.4] Chest pain [R07.9] Chest pain, unspecified type [R07.9]  DISCHARGE DIAGNOSIS:  Active Problems:   Chest pain   NSTEMI (non-ST elevated myocardial infarction) (HCC)   Elevated troponin   Anemia due to vitamin B12 deficiency   Metabolic syndrome   Hypokalemia   SECONDARY DIAGNOSIS:  History reviewed. No pertinent past medical history.  HOSPITAL COURSE:   NSTEMI.  Troponins peaked at 18,403.  Patient initially on heparin drip.  Cardiac catheterization on 02/07/2021 showed severe single-vessel disease with proximal LAD lesion with collaterals.  Cardiology recommended aggressive medical management.  LDL 122.  Patient on aspirin, Toprol-XL and Lipitor.  Plavix started after cardiac catheterization.  1 month supply of medications prescribed and close clinical follow-up with cardiology as outpatient.  Patient felt well without any chest pain or discomfort. Metabolic syndrome with impaired fasting glucose, obesity with a BMI of 35.26, hyperlipidemia.  Hemoglobin A1c 6.3.  Diet, exercise and weight loss discussed. Hypokalemia potassium supplemented during the hospital course.  Since started on losartan I will hold off on further potassium supplementation.  Recheck BMP as outpatient Vitamin B12 deficiency with macrocytic anemia.  I did give B12 injections while here in the hospital and oral supplementation as outpatient.  B12 level very low less than 50.  Recommend rechecking this as outpatient also.  DISCHARGE CONDITIONS:   Satisfactory  CONSULTS OBTAINED:  Treatment Team:  Antonieta Iba, MD  DRUG ALLERGIES:  No Known Allergies  DISCHARGE MEDICATIONS:   Allergies as of 02/08/2021   No Known Allergies      Medication List     TAKE these medications    aspirin 81 MG EC tablet Take 1 tablet (81 mg total) by mouth daily. Swallow whole. Start taking on: February 09, 2021   atorvastatin 80 MG tablet Commonly known as: LIPITOR Take 1 tablet (80 mg total) by mouth daily. Start taking on: February 09, 2021   clopidogrel 75 MG tablet Commonly known as: PLAVIX Take 1 tablet (75 mg total) by mouth daily with breakfast. Start taking on: February 09, 2021   cyanocobalamin 1000 MCG tablet Take 1 tablet (1,000 mcg total) by mouth daily.   losartan 25 MG tablet Commonly known as: COZAAR Take 0.5 tablets (12.5 mg total) by mouth daily.   metoprolol succinate 25 MG 24 hr tablet Commonly known as: TOPROL-XL Take 1 tablet (25 mg total) by mouth daily. Start taking on: February 09, 2021         DISCHARGE INSTRUCTIONS:   Follow-up PMD 5 days Follow-up cardiology 1-2 weeks  If you experience worsening of your admission symptoms, develop shortness of breath, life threatening emergency, suicidal or homicidal thoughts you must seek medical attention immediately by calling 911 or calling your MD immediately  if symptoms less severe.  You Must read complete instructions/literature along with all the possible adverse reactions/side effects for all the Medicines you take and that have been prescribed to you. Take any new Medicines after you have completely understood and accept all the possible adverse reactions/side effects.   Please note  You were cared for by a hospitalist during your  hospital stay. If you have any questions about your discharge medications or the care you received while you were in the hospital after you are discharged, you can call the unit and asked to speak with the hospitalist on call if the hospitalist that took care of you is not available. Once you  are discharged, your primary care physician will handle any further medical issues. Please note that NO REFILLS for any discharge medications will be authorized once you are discharged, as it is imperative that you return to your primary care physician (or establish a relationship with a primary care physician if you do not have one) for your aftercare needs so that they can reassess your need for medications and monitor your lab values.    Today   CHIEF COMPLAINT:   Chief Complaint  Patient presents with   Chest Pain    HISTORY OF PRESENT ILLNESS:  Peter Monroe  is a 20 y.o. male came in with chest pain   VITAL SIGNS:  Blood pressure 114/70, pulse 63, temperature 98.5 F (36.9 C), resp. rate 18, height 6' (1.829 m), weight 117.9 kg, SpO2 100 %.    PHYSICAL EXAMINATION:  GENERAL:  20 y.o.-year-old patient lying in the bed with no acute distress.  EYES: Pupils equal, round, reactive to light and accommodation. No scleral icterus. HEENT: Head atraumatic, normocephalic. Oropharynx and nasopharynx clear.  LUNGS: Normal breath sounds bilaterally, no wheezing, rales,rhonchi or crepitation.  CARDIOVASCULAR: S1, S2 normal. No murmurs, rubs, or gallops.  ABDOMEN: Soft, non-tender, non-distended.  EXTREMITIES: No pedal edema.  NEUROLOGIC: Cranial nerves II through XII are intact. Muscle strength 5/5 in all extremities. Sensation intact. Gait not checked.  PSYCHIATRIC: The patient is alert and oriented x 3.  SKIN: No obvious rash, lesion, or ulcer.   DATA REVIEW:   CBC Recent Labs  Lab 02/07/21 0459  WBC 8.7  HGB 11.3*  HCT 31.1*  PLT 301    Chemistries  Recent Labs  Lab 02/08/21 0730  NA 137  K 3.9  CL 101  CO2 27  GLUCOSE 105*  BUN 10  CREATININE 0.95  CALCIUM 9.6     Microbiology Results  Results for orders placed or performed during the hospital encounter of 02/04/21  Resp Panel by RT-PCR (Flu A&B, Covid) Nasopharyngeal Swab     Status: None   Collection  Time: 02/04/21  6:43 PM   Specimen: Nasopharyngeal Swab; Nasopharyngeal(NP) swabs in vial transport medium  Result Value Ref Range Status   SARS Coronavirus 2 by RT PCR NEGATIVE NEGATIVE Final    Comment: (NOTE) SARS-CoV-2 target nucleic acids are NOT DETECTED.  The SARS-CoV-2 RNA is generally detectable in upper respiratory specimens during the acute phase of infection. The lowest concentration of SARS-CoV-2 viral copies this assay can detect is 138 copies/mL. A negative result does not preclude SARS-Cov-2 infection and should not be used as the sole basis for treatment or other patient management decisions. A negative result may occur with  improper specimen collection/handling, submission of specimen other than nasopharyngeal swab, presence of viral mutation(s) within the areas targeted by this assay, and inadequate number of viral copies(<138 copies/mL). A negative result must be combined with clinical observations, patient history, and epidemiological information. The expected result is Negative.  Fact Sheet for Patients:  BloggerCourse.com  Fact Sheet for Healthcare Providers:  SeriousBroker.it  This test is no t yet approved or cleared by the [place] FDA and  has been authorized for detection and/or diagnosis of SARS-CoV-2 by  FDA under an Emergency Use Authorization (EUA). This EUA will remain  in effect (meaning this test can be used) for the duration of the COVID-19 declaration under Section 564(b)(1) of the Act, 21 U.S.C.section 360bbb-3(b)(1), unless the authorization is terminated  or revoked sooner.       Influenza A by PCR NEGATIVE NEGATIVE Final   Influenza B by PCR NEGATIVE NEGATIVE Final    Comment: (NOTE) The Xpert Xpress SARS-CoV-2/FLU/RSV plus assay is intended as an aid in the diagnosis of influenza from Nasopharyngeal swab specimens and should not be used as a sole basis for treatment. Nasal washings  and aspirates are unacceptable for Xpert Xpress SARS-CoV-2/FLU/RSV testing.  Fact Sheet for Patients: BloggerCourse.com  Fact Sheet for Healthcare Providers: SeriousBroker.it  This test is not yet approved or cleared by the [place] FDA and has been authorized for detection and/or diagnosis of SARS-CoV-2 by FDA under an Emergency Use Authorization (EUA). This EUA will remain in effect (meaning this test can be used) for the duration of the COVID-19 declaration under Section 564(b)(1) of the Act, 21 U.S.C. section 360bbb-3(b)(1), unless the authorization is terminated or revoked.  Performed at [facility], [address] [place], [place] [address]     RADIOLOGY:  CARDIAC CATHETERIZATION  Result Date: 02/07/2021 Conclusions: Severe single-vessel coronary artery disease with occlusion of the proximal/mid LAD.  The mid/distal LAD fills via left to left and right to left collaterals. Minimal luminal irregularities noted in the RCA. Upper normal left ventricular filling pressure (LVEDP ~15 mmHg). Recommendations: Aggressive secondary prevention, including 12 months of dual antiplatelet therapy and long-term high-intensity statin therapy. Consider viability and/or function study in a few weeks after the patient has recovered from acute MI to determine if there would be benefit from revascularization of LAD occlusion.  PCI not attempted today, as event began at least 72 hours ago with absence of symptoms for 48 hours and developing collaterals. Yvonne Kendall, MD [facility]       Management plans discussed with the patient, family and they are in agreement.  CODE STATUS:  Code Status History     Date Active Date Inactive Code Status Order ID Comments User Context   02/04/2021 2003 02/08/2021 1449 Full Code [id number]  Verdia Kuba, DO ED      Questions for Most Recent Historical Code Status (Order [id number])         TOTAL TIME TAKING CARE OF THIS PATIENT: 35 minutes.    Alford Highland M.D on 02/08/2021 at 6:37 PM    [facility]  CC: Primary care physician; Oswaldo Conroy, MD

## 2021-02-14 ENCOUNTER — Ambulatory Visit: Payer: Medicaid Other | Admitting: Internal Medicine

## 2021-02-18 ENCOUNTER — Encounter: Payer: Medicaid Other | Attending: Internal Medicine | Admitting: *Deleted

## 2021-02-18 ENCOUNTER — Other Ambulatory Visit: Payer: Self-pay

## 2021-02-18 DIAGNOSIS — I214 Non-ST elevation (NSTEMI) myocardial infarction: Secondary | ICD-10-CM

## 2021-02-18 DIAGNOSIS — I252 Old myocardial infarction: Secondary | ICD-10-CM | POA: Insufficient documentation

## 2021-02-18 NOTE — Progress Notes (Deleted)
   Patient ID: CLEAVON GOLDMAN, male    DOB: 12/02/2000, 20 y.o.   MRN: [id number]  HPI  Mr Mazon is a 20 y/o male with a history of  Echo report from 02/05/21 reviewed and showed an EF of 50-55% with severe LVH and mild/moderate MR.   LHC done 02/07/21 showed: Severe single-vessel coronary artery disease with occlusion of the proximal/mid LAD.  The mid/distal LAD fills via left to left and right to left collaterals. Minimal luminal irregularities noted in the RCA. Upper normal left ventricular filling pressure (LVEDP ~15 mmHg). Recommendations: Aggressive secondary prevention, including 12 months of dual antiplatelet therapy and long-term high-intensity statin therapy. Consider viability and/or function study in a few weeks after the patient has recovered from acute MI to determine if there would be benefit from revascularization of LAD occlusion.  PCI not attempted today, as event began at least 72 hours ago with absence of symptoms for 48 hours and developing collaterals.  Admitted 02/04/21 due to NSTEMI. Initially placed on heparin drip. Cardiology consult obtained. Cath completed. Hypokalemia supplemented. Discharged after 4 days.   He presents today for his initial visit with a chief complaint of  Review of Systems    Physical Exam    Assessment & Plan:  1: Chronic heart failure with preserved ejection fraction with structural changes (LVH)- - NYHA class  2: HTN- - BP - BMP on 02/08/21 reviewed and showed sodium 137, potassium 3.9, creatinine 0.95 & GFR > 60  3: CAD- - recent NSTEMI - cath completed   4: Tobacco use-

## 2021-02-18 NOTE — Progress Notes (Signed)
Initial telephone orientation completed. Diagnosis can be found in [facility] 7/22. EP orientation scheduled for Thursday 8/11 at 10am.

## 2021-02-21 ENCOUNTER — Ambulatory Visit (INDEPENDENT_AMBULATORY_CARE_PROVIDER_SITE_OTHER): Payer: Medicaid Other | Admitting: Internal Medicine

## 2021-02-21 ENCOUNTER — Other Ambulatory Visit: Payer: Self-pay

## 2021-02-21 ENCOUNTER — Ambulatory Visit: Payer: Medicaid Other | Admitting: Family

## 2021-02-21 ENCOUNTER — Encounter: Payer: Self-pay | Admitting: Internal Medicine

## 2021-02-21 VITALS — BP 112/68 | HR 75 | Ht 72.0 in | Wt 266.0 lb

## 2021-02-21 DIAGNOSIS — I429 Cardiomyopathy, unspecified: Secondary | ICD-10-CM | POA: Diagnosis not present

## 2021-02-21 NOTE — Patient Instructions (Addendum)
Medication Instructions:  No changes made today.  *If you need a refill on your cardiac medications before your next appointment, please call your pharmacy*   Lab Work: Today: BMET, CBC  If you have labs (blood work) drawn today and your tests are completely normal, you will receive your results only by: MyChart Message (if you have MyChart) OR A paper copy in the mail If you have any lab test that is abnormal or we need to change your treatment, we will call you to review the results.   Testing/Procedures: Your physician has requested that you have a cardiac MRI. Cardiac MRI uses a computer to create images of your heart as its beating, producing both still and moving pictures of your heart and major blood vessels. For further information please visit [email or web address]. Please follow the instruction sheet given to you today for more information.   Follow-Up: Follow up with your physician will depend on test results.    You are scheduled for Cardiac MRI on ______________. Please arrive at the [facility] main entrance of [facility] at ________________ (30-45 minutes prior to test start time). ?  [facility] [address] [place], [place] [address] [phone]  Please take advantage of the free valet parking available at the MAIN entrance (A entrance). Proceed to the [facility] Radiology Department (First Floor). ? Magnetic resonance imaging (MRI) is a painless test that produces images of the inside of the body without using Xrays.  During an MRI, strong magnets and radio waves work together in a Data processing manager to form detailed images.   MRI images may provide more details about a medical condition than X-rays, CT scans, and ultrasounds can provide.  You may be given earphones to listen for instructions.  You may eat a light breakfast and take medications as ordered with the exception of HCTZ (fluid pill, other). Please avoid stimulants for 12 hr  prior to test. (Ie. Caffeine, nicotine, chocolate, or antihistamine medications)  If a contrast material will be used, an IV will be inserted into one of your veins. Contrast material will be injected into your IV. It will leave your body through your urine within a day. You may be told to drink plenty of fluids to help flush the contrast material out of your system.  You will be asked to remove all metal, including: Watch, jewelry, and other metal objects including hearing aids, hair pieces and dentures. Also wearable glucose monitoring systems (ie. Freestyle Libre and Omnipods) (Braces and fillings normally are not a problem.)   TEST WILL TAKE APPROXIMATELY 1 HOUR  PLEASE NOTIFY SCHEDULING AT LEAST 24 HOURS IN ADVANCE IF YOU ARE UNABLE TO KEEP YOUR APPOINTMENT. [phone]  Please call Rockwell Alexandria, cardiac imaging nurse navigator with any questions/concerns. Rockwell Alexandria RN Navigator Cardiac Imaging Larey Brick RN Navigator Cardiac Imaging [facility] [phone] Office

## 2021-02-21 NOTE — Progress Notes (Signed)
Cardiology Office Note   Date:  02/21/2021   ID:  [name], DOB 20-Aug-2000, MRN [id number]  PCP:  Oswaldo Conroy, MD  Cardiologist:   Dietrich Pates, MD   Pt presents for follow up of CAD     History of Present Illness: Peter Monroe is a 20 y.o. male with a history of  obesity, marijuana use, ? HTN who presented to [facility] on 02/04/21 with CP   Two days prior to admit the patient developed CP that resolved   The day before admit he felt OK   The day of admission he got up, felt  OK, went to help his dad paint.  Took a break to go to the store for something to drink and on the way developed CP   Pain intensified, assoc with N/V    Pt went home, took a shower  No relief   Went to [facility] and then to [facility] ED    In ED, EKG with sl ST elev III, several leads with  slight ST depression, not significant  Initiral troponin 236 then 426  Patient given NTG iniitally wieh relief CT of chest done to r/o PE  Pt redeveloped pain about 5 hours later and was given colchicine and pain eased.    Following AM,  trop 11,000.   Heparin started    Pt remained pain free from then on  Peak trop 18000 The pt underwent echo that showed hypokinesis / akinesis of LV apex   LVEF 40%  ( I disagree with official read on echo in Epic)  On Monday the pt underwent L heart cath which showed occlusion of the LAD with faint collaterals that filled distal vessel   LVEF 15 mm Hg      Given the pt was pain free  plan was to continue medical Rx (ASA, Plavix x 12 months, statin) and to follow up as outpt for possible viability study  Inflammatory markers negative The pt was also sent home on low dose Toprol and Losartan Note the pt says he had a viral infection about 2 months prior to admit   Recovered   Not sure if it was COVID.  Did not test.  Since d/c the pt says his breathing has been OK   He denies CP   No dizziness  No LE edema  No palpitations        Current Meds  Medication Sig   aspirin EC 81 MG EC  tablet Take 1 tablet (81 mg total) by mouth daily. Swallow whole.   atorvastatin (LIPITOR) 80 MG tablet Take 1 tablet (80 mg total) by mouth daily.   clopidogrel (PLAVIX) 75 MG tablet Take 1 tablet (75 mg total) by mouth daily with breakfast.   losartan (COZAAR) 25 MG tablet Take 0.5 tablets (12.5 mg total) by mouth daily.   metoprolol succinate (TOPROL-XL) 25 MG 24 hr tablet Take 1 tablet (25 mg total) by mouth daily.   vitamin B-12 1000 MCG tablet Take 1 tablet (1,000 mcg total) by mouth daily.     Allergies:   Patient has no known allergies.   No past medical history on file.  Past Surgical History:  Procedure Laterality Date   LEFT HEART CATH AND CORONARY ANGIOGRAPHY N/A 02/07/2021   Procedure: LEFT HEART CATH AND CORONARY ANGIOGRAPHY;  Surgeon: Yvonne Kendall, MD;  Location: [facility] INVASIVE CV LAB;  Service: Cardiovascular;  Laterality: N/A;     Social History:  The patient  reports that he has never smoked. He has never used smokeless tobacco. He reports that he does not drink alcohol and does not use drugs.   Family History:  The patient's family history is not on file.    ROS:  Please see the history of present illness. All other systems are reviewed and  Negative to the above problem except as noted.    PHYSICAL EXAM: VS:  BP 112/68   Pulse 75   Ht 6' (1.829 m)   Wt 266 lb (120.7 kg)   SpO2 97%   BMI 36.08 kg/m   GEN: Morbidly obese 19 yo  in no acute distress  HEENT: normal  Neck: no JVD, carotid bruits Cardiac: RRR; no murmurs,no edema  Respiratory:  clear to auscultation bilaterally GI: soft, nontender, nondistended, + BS  No hepatomegaly  MS: no deformity Moving all extremities   Skin: warm and dry, no rash Neuro:  Strength and sensation are intact Psych: euthymic mood, full affect   EKG:  EKG is not ordered today.    CATH   02/07/21  Severe single-vessel coronary artery disease with occlusion of the proximal/mid LAD.  The mid/distal LAD fills via left  to left and right to left collaterals. Minimal luminal irregularities noted in the RCA. Upper normal left ventricular filling pressure (LVEDP ~15 mmHg).   Recommendations: Aggressive secondary prevention, including 12 months of dual antiplatelet therapy and long-term high-intensity statin therapy. Consider viability and/or function study in a few weeks after the patient has recovered from acute MI to determine if there would be benefit from revascularization of LAD occlusion.  PCI not attempted today, as event began at least 72 hours ago with absence of symptoms for 48 hours and developing collaterals.    Lipid Panel    Component Value Date/Time   CHOL 172 02/05/2021 0712   TRIG 118 02/05/2021 0712   HDL 26 (L) 02/05/2021 0712   CHOLHDL 6.6 02/05/2021 0712   VLDL 24 02/05/2021 0712   LDLCALC 122 (H) 02/05/2021 0712      Wt Readings from Last 3 Encounters:  02/21/21 266 lb (120.7 kg)  02/08/21 260 lb (117.9 kg)      ASSESSMENT AND PLAN:  1  CAD  Pt s/p NSTEMI in late July 2022.   EKG without Q Waves   Echo with apical wall motion abnormalitiy and LVEF approximately 40%    He had 2 noncontiguous days of CP, but, given collaterals it appears that problems may have been developing for awhile  A true  acute occlusion should have lead to a STEMI and probable VF. Th pt denies tobacco but does smoke some marijuana   He consumes sugary foods and indeed his A1C is 6.3 His is morbidly obbese with fatty liver changes   None of problems seem to predispose to event at such a young age.    ? About viral infection earlier this year, if there was some myocardial/vascular involvement      I would  1.   Keep on same meds  (Toprol, Losartan, Plavix, Statin) Check labs  If K OK then increase losartan to 25 mg    2  He is pain free but would set up for MRI to evaluate for viability at apex and also possible sites of inflammation elsewhere  3  Plan for repeat echo later this fall   2   HL   On high  dose statin   Levels will need to be followed   In  July LDL was 122  HDL 26   Trig 118  When returns will check ApoB and Lp(a) and lipomed  3  Diabetes   Pt's HGB A1C is 6.3   He has significant room for improvement in diet   Discussed limiting sugars, carbs    Note that his CT showed fatty infiltrate of liver    Will follow over time  4  Morbid obesity   AgainDiscussed diet/ sugars/ time restricted eating   Will follow.     Current medicines are reviewed at length with the patient today.  The patient does not have concerns regarding medicines.  Signed, Dietrich Pates, MD  02/21/2021 1:23 PM    [facility] [address] [place], [place], [place]  [address] Phone: [phone]; Fax: [phone]

## 2021-02-22 LAB — BASIC METABOLIC PANEL
BUN/Creatinine Ratio: 10 (ref 9–20)
BUN: 9 mg/dL (ref 6–20)
CO2: 19 mmol/L — ABNORMAL LOW (ref 20–29)
Calcium: 10 mg/dL (ref 8.7–10.2)
Chloride: 103 mmol/L (ref 96–106)
Creatinine, Ser: 0.94 mg/dL (ref 0.76–1.27)
Glucose: 81 mg/dL (ref 65–99)
Potassium: 4.5 mmol/L (ref 3.5–5.2)
Sodium: 141 mmol/L (ref 134–144)
eGFR: 119 mL/min/{1.73_m2} (ref >59)

## 2021-02-22 LAB — CBC
Hematocrit: 39.1 % (ref 37.5–51.0)
Hemoglobin: 13 g/dL (ref 13.0–17.7)
MCH: 30.6 pg (ref 26.6–33.0)
MCHC: 33.2 g/dL (ref 31.5–35.7)
MCV: 92 fL (ref 79–97)
Platelets: 545 10*3/uL — ABNORMAL HIGH (ref 150–450)
RBC: 4.25 x10E6/uL (ref 4.14–5.80)
RDW: 19.4 % — ABNORMAL HIGH (ref 11.6–15.4)
WBC: 7.9 10*3/uL (ref 3.4–10.8)

## 2021-02-28 ENCOUNTER — Telehealth: Payer: Self-pay | Admitting: *Deleted

## 2021-02-28 MED ORDER — LOSARTAN POTASSIUM 25 MG PO TABS: 25.0000 mg | ORAL_TABLET | Freq: Every day | ORAL | 3 refills | Status: AC

## 2021-02-28 NOTE — Telephone Encounter (Signed)
-----   Message from Dietrich Pates V, MD sent at 02/22/2021  8:44 AM EDT ----- Labs are OK I would recomm he try cozaar 25 mg That is still a very low dose    See how feels    If dizzy can back down  Keep on toprol

## 2021-03-02 NOTE — Progress Notes (Deleted)
   Patient ID: Peter Monroe, male    DOB: 11/15/2000, 20 y.o.   MRN: [id number]  HPI  Peter Monroe is a 20 y/o male with a history of  Echo report from 02/05/21 reviewed and showed an EF of 50-55% with severe LVH and mild/moderate Peter.   LHC done 02/07/21 showed: Severe single-vessel coronary artery disease with occlusion of the proximal/mid LAD.  The mid/distal LAD fills via left to left and right to left collaterals. Minimal luminal irregularities noted in the RCA. Upper normal left ventricular filling pressure (LVEDP ~15 mmHg). Recommendations: Aggressive secondary prevention, including 12 months of dual antiplatelet therapy and long-term high-intensity statin therapy. Consider viability and/or function study in a few weeks after the patient has recovered from acute MI to determine if there would be benefit from revascularization of LAD occlusion.  PCI not attempted today, as event began at least 72 hours ago with absence of symptoms for 48 hours and developing collaterals.  Admitted 02/04/21 due to NSTEMI. Initially placed on heparin drip. Cardiology consult obtained. Cath completed. Hypokalemia supplemented. Discharged after 4 days.   He presents today for his initial visit with a chief complaint of  Review of Systems    Physical Exam    Assessment & Plan:  1: Chronic heart failure with preserved ejection fraction with structural changes (LVH)- - NYHA class  2: HTN- - BP - BMP on 02/21/21 reviewed and showed sodium 141, potassium 4.5, creatinine 0.94 & GFR 119  3: CAD- - recent NSTEMI - saw cardiology Tenny Craw) 02/21/21 - cath completed 02/07/21  4: Tobacco use-

## 2021-03-03 ENCOUNTER — Telehealth: Payer: Self-pay | Admitting: Family

## 2021-03-03 ENCOUNTER — Ambulatory Visit: Payer: Medicaid Other | Admitting: Family

## 2021-03-03 NOTE — Telephone Encounter (Signed)
Patient did not show for his Heart Failure Clinic appointment on 03/03/21. Will attempt to reschedule.

## 2021-03-10 ENCOUNTER — Other Ambulatory Visit: Payer: Self-pay

## 2021-03-10 VITALS — Ht 71.0 in | Wt 272.8 lb

## 2021-03-10 DIAGNOSIS — I252 Old myocardial infarction: Secondary | ICD-10-CM | POA: Diagnosis present

## 2021-03-10 DIAGNOSIS — I214 Non-ST elevation (NSTEMI) myocardial infarction: Secondary | ICD-10-CM

## 2021-03-10 NOTE — Progress Notes (Signed)
Cardiac Individual Treatment Plan  Patient Details  Name: Peter Monroe MRN: [id number] Date of Birth: [birth year] Referring Provider:   Flowsheet Row Cardiac Rehab from 03/10/2021 in [facility] Cardiac and Pulmonary Rehab  Referring Provider Dietrich Pates MD       Initial Encounter Date:  Flowsheet Row Cardiac Rehab from 03/10/2021 in [facility] Cardiac and Pulmonary Rehab  Date 03/10/21       Visit Diagnosis: NSTEMI (non-ST elevation myocardial infarction) [facility])  Patient's Home Medications on Admission:  Current Outpatient Medications:    aspirin EC 81 MG EC tablet, Take 1 tablet (81 mg total) by mouth daily. Swallow whole., Disp: 30 tablet, Rfl: 0   atorvastatin (LIPITOR) 80 MG tablet, Take 1 tablet (80 mg total) by mouth daily., Disp: 30 tablet, Rfl: 0   clopidogrel (PLAVIX) 75 MG tablet, Take 1 tablet (75 mg total) by mouth daily with breakfast., Disp: 30 tablet, Rfl: 0   losartan (COZAAR) 25 MG tablet, Take 1 tablet (25 mg total) by mouth daily., Disp: 90 tablet, Rfl: 3   metoprolol succinate (TOPROL-XL) 25 MG 24 hr tablet, Take 1 tablet (25 mg total) by mouth daily., Disp: 30 tablet, Rfl: 0   vitamin B-12 1000 MCG tablet, Take 1 tablet (1,000 mcg total) by mouth daily., Disp: 30 tablet, Rfl: 0  Past Medical History: No past medical history on file.  Tobacco Use: Social History   Tobacco Use  Smoking Status Never  Smokeless Tobacco Never    Labs: Recent Review Flowsheet Data     Labs for ITP Cardiac and Pulmonary Rehab Latest Ref Rng & Units 02/05/2021   Cholestrol 0 - 200 mg/dL 502   LDLCALC 0 - 99 mg/dL 774(J)   HDL >28 mg/dL 78(M)   Trlycerides <767 mg/dL 209   Hemoglobin O7S 4.8 - 5.6 % 6.3(H)        Exercise Target Goals: Exercise Program Goal: Individual exercise prescription set using results from initial 6 min walk test and THRR while considering  patient's activity barriers and safety.   Exercise Prescription Goal: Initial exercise prescription builds to  30-45 minutes a day of aerobic activity, 2-3 days per week.  Home exercise guidelines will be given to patient during program as part of exercise prescription that the participant will acknowledge.   Education: Aerobic Exercise: - Group verbal and visual presentation on the components of exercise prescription. Introduces F.I.T.T principle from ACSM for exercise prescriptions.  Reviews F.I.T.T. principles of aerobic exercise including progression. Written material given at graduation.   Education: Resistance Exercise: - Group verbal and visual presentation on the components of exercise prescription. Introduces F.I.T.T principle from ACSM for exercise prescriptions  Reviews F.I.T.T. principles of resistance exercise including progression. Written material given at graduation.    Education: Exercise & Equipment Safety: - Individual verbal instruction and demonstration of equipment use and safety with use of the equipment. Flowsheet Row Cardiac Rehab from 03/10/2021 in [facility] Cardiac and Pulmonary Rehab  Education need identified 03/10/21  Date 03/10/21  Educator KL  Instruction Review Code 1- Verbalizes Understanding       Education: Exercise Physiology & General Exercise Guidelines: - Group verbal and written instruction with models to review the exercise physiology of the cardiovascular system and associated critical values. Provides general exercise guidelines with specific guidelines to those with heart or lung disease.  Flowsheet Row Cardiac Rehab from 03/10/2021 in [facility] Cardiac and Pulmonary Rehab  Education need identified 03/10/21       Education: Flexibility, Balance, Mind/Body Relaxation: -  Group verbal and visual presentation with interactive activity on the components of exercise prescription. Introduces F.I.T.T principle from ACSM for exercise prescriptions. Reviews F.I.T.T. principles of flexibility and balance exercise training including progression. Also discusses the mind  body connection.  Reviews various relaxation techniques to help reduce and manage stress (i.e. Deep breathing, progressive muscle relaxation, and visualization). Balance handout provided to take home. Written material given at graduation.   Activity Barriers & Risk Stratification:  Activity Barriers & Cardiac Risk Stratification - 03/10/21 0953       Activity Barriers & Cardiac Risk Stratification   Activity Barriers None    Cardiac Risk Stratification High             6 Minute Walk:  6 Minute Walk     Row Name 03/10/21 0952         6 Minute Walk   Phase Initial     Distance 1040 feet     Walk Time 6 minutes     # of Rest Breaks 0     MPH 1.96     METS 4.62     RPE 7     Perceived Dyspnea  0     VO2 Peak 16.02     Symptoms No     Resting HR 68 bpm     Resting BP 138/78     Resting Oxygen Saturation  97 %     Exercise Oxygen Saturation  during 6 min walk 98 %     Max Ex. HR 97 bpm     Max Ex. BP 146/84     2 Minute Post BP 140/80              Oxygen Initial Assessment:   Oxygen Re-Evaluation:   Oxygen Discharge (Final Oxygen Re-Evaluation):   Initial Exercise Prescription:  Initial Exercise Prescription - 03/10/21 1000       Date of Initial Exercise RX and Referring Provider   Date 03/10/21    Referring Provider Dietrich Pates MD      Treadmill   MPH 2.2    Grade 1    Minutes 15    METs 2.99      REL-XR   Level 3    Speed 50    Minutes 15    METs 4.6      T5 Nustep   Level 2    SPM 80    Minutes 15    METs 4.6      Track   Laps 28    Minutes 15    METs 2.52      Prescription Details   Frequency (times per week) 2    Duration Progress to 30 minutes of continuous aerobic without signs/symptoms of physical distress      Intensity   THRR 40-80% of Max Heartrate 120-173    Ratings of Perceived Exertion 11-13    Perceived Dyspnea 0-4      Progression   Progression Continue to progress workloads to maintain intensity without  signs/symptoms of physical distress.      Resistance Training   Training Prescription Yes    Weight 5 lb    Reps 10-15             Perform Capillary Blood Glucose checks as needed.  Exercise Prescription Changes:   Exercise Prescription Changes     Row Name 03/10/21 1000             Response to Exercise   Blood Pressure (  Admit) 138/78       Blood Pressure (Exercise) 146/84       Blood Pressure (Exit) 140/80       Heart Rate (Admit) 68 bpm       Heart Rate (Exercise) 97 bpm       Heart Rate (Exit) 70 bpm       Oxygen Saturation (Admit) 97 %       Oxygen Saturation (Exercise) 98 %       Oxygen Saturation (Exit) 98 %       Rating of Perceived Exertion (Exercise) 7       Perceived Dyspnea (Exercise) 0       Symptoms none       Comments walk test results                Exercise Comments:   Exercise Goals and Review:   Exercise Goals     Row Name 03/10/21 1015             Exercise Goals   Increase Physical Activity Yes       Intervention Provide advice, education, support and counseling about physical activity/exercise needs.;Develop an individualized exercise prescription for aerobic and resistive training based on initial evaluation findings, risk stratification, comorbidities and participant's personal goals.       Expected Outcomes Short Term: Attend rehab on a regular basis to increase amount of physical activity.;Long Term: Add in home exercise to make exercise part of routine and to increase amount of physical activity.;Long Term: Exercising regularly at least 3-5 days a week.       Increase Strength and Stamina Yes       Intervention Provide advice, education, support and counseling about physical activity/exercise needs.;Develop an individualized exercise prescription for aerobic and resistive training based on initial evaluation findings, risk stratification, comorbidities and participant's personal goals.       Expected Outcomes Short Term:  Increase workloads from initial exercise prescription for resistance, speed, and METs.;Short Term: Perform resistance training exercises routinely during rehab and add in resistance training at home;Long Term: Improve cardiorespiratory fitness, muscular endurance and strength as measured by increased METs and functional capacity (6MWT)       Able to understand and use rate of perceived exertion (RPE) scale Yes       Intervention Provide education and explanation on how to use RPE scale       Expected Outcomes Short Term: Able to use RPE daily in rehab to express subjective intensity level;Long Term:  Able to use RPE to guide intensity level when exercising independently       Able to understand and use Dyspnea scale Yes       Intervention Provide education and explanation on how to use Dyspnea scale       Expected Outcomes Short Term: Able to use Dyspnea scale daily in rehab to express subjective sense of shortness of breath during exertion;Long Term: Able to use Dyspnea scale to guide intensity level when exercising independently       Knowledge and understanding of Target Heart Rate Range (THRR) Yes       Intervention Provide education and explanation of THRR including how the numbers were predicted and where they are located for reference       Expected Outcomes Short Term: Able to state/look up THRR;Long Term: Able to use THRR to govern intensity when exercising independently;Short Term: Able to use daily as guideline for intensity in rehab  Able to check pulse independently Yes       Intervention Provide education and demonstration on how to check pulse in carotid and radial arteries.;Review the importance of being able to check your own pulse for safety during independent exercise       Expected Outcomes Short Term: Able to explain why pulse checking is important during independent exercise;Long Term: Able to check pulse independently and accurately       Understanding of Exercise  Prescription Yes       Intervention Provide education, explanation, and written materials on patient's individual exercise prescription       Expected Outcomes Short Term: Able to explain program exercise prescription;Long Term: Able to explain home exercise prescription to exercise independently                Exercise Goals Re-Evaluation :   Discharge Exercise Prescription (Final Exercise Prescription Changes):  Exercise Prescription Changes - 03/10/21 1000       Response to Exercise   Blood Pressure (Admit) 138/78    Blood Pressure (Exercise) 146/84    Blood Pressure (Exit) 140/80    Heart Rate (Admit) 68 bpm    Heart Rate (Exercise) 97 bpm    Heart Rate (Exit) 70 bpm    Oxygen Saturation (Admit) 97 %    Oxygen Saturation (Exercise) 98 %    Oxygen Saturation (Exit) 98 %    Rating of Perceived Exertion (Exercise) 7    Perceived Dyspnea (Exercise) 0    Symptoms none    Comments walk test results             Nutrition:  Target Goals: Understanding of nutrition guidelines, daily intake of sodium 1500mg , cholesterol 200mg , calories 30% from fat and 7% or less from saturated fats, daily to have 5 or more servings of fruits and vegetables.  Education: All About Nutrition: -Group instruction provided by verbal, written material, interactive activities, discussions, models, and posters to present general guidelines for heart healthy nutrition including fat, fiber, MyPlate, the role of sodium in heart healthy nutrition, utilization of the nutrition label, and utilization of this knowledge for meal planning. Follow up email sent as well. Written material given at graduation. Flowsheet Row Cardiac Rehab from 03/10/2021 in [facility] Cardiac and Pulmonary Rehab  Education need identified 03/10/21       Biometrics:  Pre Biometrics - 03/10/21 0953       Pre Biometrics   Height 5\' 11"  (1.803 m)    Weight 272 lb 12.8 oz (123.7 kg)    BMI (Calculated) 38.06               Nutrition Therapy Plan and Nutrition Goals:  Nutrition Therapy & Goals - 03/10/21 1025       Intervention Plan   Intervention Prescribe, educate and counsel regarding individualized specific dietary modifications aiming towards targeted core components such as weight, hypertension, lipid management, diabetes, heart failure and other comorbidities.    Expected Outcomes Short Term Goal: Understand basic principles of dietary content, such as calories, fat, sodium, cholesterol and nutrients.;Short Term Goal: A plan has been developed with personal nutrition goals set during dietitian appointment.;Long Term Goal: Adherence to prescribed nutrition plan.             Nutrition Assessments:  MEDIFICTS Score Key: ?70 Need to make dietary changes  40-70 Heart Healthy Diet ? 40 Therapeutic Level Cholesterol Diet  Flowsheet Row Cardiac Rehab from 03/10/2021 in [facility] Cardiac and Pulmonary Rehab  Picture Your Plate  Total Score on Admission 59      Picture Your Plate Scores: <26 Unhealthy dietary pattern with much room for improvement. 41-50 Dietary pattern unlikely to meet recommendations for good health and room for improvement. 51-60 More healthful dietary pattern, with some room for improvement.  >60 Healthy dietary pattern, although there may be some specific behaviors that could be improved.    Nutrition Goals Re-Evaluation:   Nutrition Goals Discharge (Final Nutrition Goals Re-Evaluation):   Psychosocial: Target Goals: Acknowledge presence or absence of significant depression and/or stress, maximize coping skills, provide positive support system. Participant is able to verbalize types and ability to use techniques and skills needed for reducing stress and depression.   Education: Stress, Anxiety, and Depression - Group verbal and visual presentation to define topics covered.  Reviews how body is impacted by stress, anxiety, and depression.  Also discusses healthy ways to  reduce stress and to treat/manage anxiety and depression.  Written material given at graduation.   Education: Sleep Hygiene -Provides group verbal and written instruction about how sleep can affect your health.  Define sleep hygiene, discuss sleep cycles and impact of sleep habits. Review good sleep hygiene tips.    Initial Review & Psychosocial Screening:  Initial Psych Review & Screening - 02/18/21 1305       Initial Review   Current issues with None Identified      Family Dynamics   Good Support System? Yes   parents, girlfriend     Barriers   Psychosocial barriers to participate in program There are no identifiable barriers or psychosocial needs.;The patient should benefit from training in stress management and relaxation.      Screening Interventions   Interventions Encouraged to exercise;Provide feedback about the scores to participant;To provide support and resources with identified psychosocial needs    Expected Outcomes Short Term goal: Utilizing psychosocial counselor, staff and physician to assist with identification of specific Stressors or current issues interfering with healing process. Setting desired goal for each stressor or current issue identified.;Short Term goal: Identification and review with participant of any Quality of Life or Depression concerns found by scoring the questionnaire.;Long Term Goal: Stressors or current issues are controlled or eliminated.;Long Term goal: The participant improves quality of Life and PHQ9 Scores as seen by post scores and/or verbalization of changes             Quality of Life Scores:   Quality of Life - 03/10/21 1017       Quality of Life   Select Quality of Life      Quality of Life Scores   Health/Function Pre 27.83 %    Socioeconomic Pre 27.75 %    Psych/Spiritual Pre 27.93 %    Family Pre 27.6 %    GLOBAL Pre 27.8 %            Scores of 19 and below usually indicate a poorer quality of life in these areas.   A difference of  2-3 points is a clinically meaningful difference.  A difference of 2-3 points in the total score of the Quality of Life Index has been associated with significant improvement in overall quality of life, self-image, physical symptoms, and general health in studies assessing change in quality of life.  PHQ-9: Recent Review Flowsheet Data     Depression screen [facility] 2/9 03/10/2021   Decreased Interest 0   Down, Depressed, Hopeless 0   PHQ - 2 Score 0   Altered sleeping 1   Tired,  decreased energy 0   Change in appetite 1   Feeling bad or failure about yourself  1   Trouble concentrating 0   Moving slowly or fidgety/restless 0   Suicidal thoughts 0   PHQ-9 Score 3   Difficult doing work/chores Not difficult at all      Interpretation of Total Score  Total Score Depression Severity:  1-4 = Minimal depression, 5-9 = Mild depression, 10-14 = Moderate depression, 15-19 = Moderately severe depression, 20-27 = Severe depression   Psychosocial Evaluation and Intervention:  Psychosocial Evaluation - 02/18/21 1310       Psychosocial Evaluation & Interventions   Interventions Encouraged to exercise with the program and follow exercise prescription    Comments Horace is coming to cardiac rehab after having a NSTEMI. It did come as a suprise to him as he is young, but states he is handling it well. He is motivated to take care of his body and manage his health. His parents and girlfriend are his support system. He had just gotten a job at [employer] when his MI happened, so he is hoping to find another job soon.    Expected Outcomes Short: attend cardiac rehab for education and exercise. Long: develop positive self care habits.    Continue Psychosocial Services  Follow up required by staff             Psychosocial Re-Evaluation:   Psychosocial Discharge (Final Psychosocial Re-Evaluation):   Vocational Rehabilitation: Provide vocational rehab assistance to qualifying  candidates.   Vocational Rehab Evaluation & Intervention:   Education: Education Goals: Education classes will be provided on a variety of topics geared toward better understanding of heart health and risk factor modification. Participant will state understanding/return demonstration of topics presented as noted by education test scores.  Learning Barriers/Preferences:  Learning Barriers/Preferences - 02/18/21 1305       Learning Barriers/Preferences   Learning Barriers None    Learning Preferences None             General Cardiac Education Topics:  AED/CPR: - Group verbal and written instruction with the use of models to demonstrate the basic use of the AED with the basic ABC's of resuscitation.   Anatomy and Cardiac Procedures: - Group verbal and visual presentation and models provide information about basic cardiac anatomy and function. Reviews the testing methods done to diagnose heart disease and the outcomes of the test results. Describes the treatment choices: Medical Management, Angioplasty, or Coronary Bypass Surgery for treating various heart conditions including Myocardial Infarction, Angina, Valve Disease, and Cardiac Arrhythmias.  Written material given at graduation.   Medication Safety: - Group verbal and visual instruction to review commonly prescribed medications for heart and lung disease. Reviews the medication, class of the drug, and side effects. Includes the steps to properly store meds and maintain the prescription regimen.  Written material given at graduation.   Intimacy: - Group verbal instruction through game format to discuss how heart and lung disease can affect sexual intimacy. Written material given at graduation..   Know Your Numbers and Heart Failure: - Group verbal and visual instruction to discuss disease risk factors for cardiac and pulmonary disease and treatment options.  Reviews associated critical values for Overweight/Obesity,  Hypertension, Cholesterol, and Diabetes.  Discusses basics of heart failure: signs/symptoms and treatments.  Introduces Heart Failure Zone chart for action plan for heart failure.  Written material given at graduation. Flowsheet Row Cardiac Rehab from 03/10/2021 in [facility] Cardiac and Pulmonary Rehab  Education need identified 03/10/21       Infection Prevention: - Provides verbal and written material to individual with discussion of infection control including proper hand washing and proper equipment cleaning during exercise session. Flowsheet Row Cardiac Rehab from 03/10/2021 in [facility] Cardiac and Pulmonary Rehab  Education need identified 03/10/21  Date 03/10/21  Educator KL  Instruction Review Code 1- Verbalizes Understanding       Falls Prevention: - Provides verbal and written material to individual with discussion of falls prevention and safety. Flowsheet Row Cardiac Rehab from 03/10/2021 in [facility] Cardiac and Pulmonary Rehab  Education need identified 03/10/21  Date 03/10/21  Educator KL  Instruction Review Code 1- Verbalizes Understanding       Other: -Provides group and verbal instruction on various topics (see comments)   Knowledge Questionnaire Score:  Knowledge Questionnaire Score - 03/10/21 0926       Knowledge Questionnaire Score   Pre Score 18/26: Nutrition, Exercise, MI. HF             Core Components/Risk Factors/Patient Goals at Admission:  Personal Goals and Risk Factors at Admission - 03/10/21 1016       Core Components/Risk Factors/Patient Goals on Admission    Weight Management Yes;Weight Loss    Intervention Weight Management: Develop a combined nutrition and exercise program designed to reach desired caloric intake, while maintaining appropriate intake of nutrient and fiber, sodium and fats, and appropriate energy expenditure required for the weight goal.;Weight Management: Provide education and appropriate resources to help participant work on and  attain dietary goals.;Weight Management/Obesity: Establish reasonable short term and long term weight goals.    Admit Weight 272 lb (123.4 kg)    Goal Weight: Short Term 265 lb (120.2 kg)    Goal Weight: Long Term 200 lb (90.7 kg)    Expected Outcomes Short Term: Continue to assess and modify interventions until short term weight is achieved;Long Term: Adherence to nutrition and physical activity/exercise program aimed toward attainment of established weight goal;Weight Loss: Understanding of general recommendations for a balanced deficit meal plan, which promotes 1-2 lb weight loss per week and includes a negative energy balance of [phone] kcal/d;Understanding recommendations for meals to include 15-35% energy as protein, 25-35% energy from fat, 35-60% energy from carbohydrates, less than 200mg  of dietary cholesterol, 20-35 gm of total fiber daily;Understanding of distribution of calorie intake throughout the day with the consumption of 4-5 meals/snacks    Hypertension Yes    Intervention Provide education on lifestyle modifcations including regular physical activity/exercise, weight management, moderate sodium restriction and increased consumption of fresh fruit, vegetables, and low fat dairy, alcohol moderation, and smoking cessation.;Monitor prescription use compliance.    Expected Outcomes Short Term: Continued assessment and intervention until BP is < 140/79mm HG in hypertensive participants. < 130/79mm HG in hypertensive participants with diabetes, heart failure or chronic kidney disease.;Long Term: Maintenance of blood pressure at goal levels.             Education:Diabetes - Individual verbal and written instruction to review signs/symptoms of diabetes, desired ranges of glucose level fasting, after meals and with exercise. Acknowledge that pre and post exercise glucose checks will be done for 3 sessions at entry of program.   Core Components/Risk Factors/Patient Goals Review:    Core  Components/Risk Factors/Patient Goals at Discharge (Final Review):    ITP Comments:  ITP Comments     Row Name 02/18/21 1309 03/10/21 0921         ITP Comments Initial  telephone orientation completed. Diagnosis can be found in [facility] 7/22. EP orientation scheduled for Thursday 8/11 at 10am. Completed and gym orientation. Initial ITP created and sent for review to Dr. Bethann Punches, Medical Director.               Comments: Initial ITP

## 2021-03-10 NOTE — Patient Instructions (Signed)
Patient Instructions  Patient Details  Name: Peter Monroe MRN: [id number] Date of Birth: 2001-01-01 Referring Provider:  Pricilla Riffle, MD  Below are your personal goals for exercise, nutrition, and risk factors. Our goal is to help you stay on track towards obtaining and maintaining these goals. We will be discussing your progress on these goals with you throughout the program.  Initial Exercise Prescription:  Initial Exercise Prescription - 03/10/21 1000       Date of Initial Exercise RX and Referring Provider   Date 03/10/21    Referring Provider Dietrich Pates MD      Treadmill   MPH 2.2    Grade 1    Minutes 15    METs 2.99      REL-XR   Level 3    Speed 50    Minutes 15    METs 4.6      T5 Nustep   Level 2    SPM 80    Minutes 15    METs 4.6      Track   Laps 28    Minutes 15    METs 2.52      Prescription Details   Frequency (times per week) 2    Duration Progress to 30 minutes of continuous aerobic without signs/symptoms of physical distress      Intensity   THRR 40-80% of Max Heartrate 120-173    Ratings of Perceived Exertion 11-13    Perceived Dyspnea 0-4      Progression   Progression Continue to progress workloads to maintain intensity without signs/symptoms of physical distress.      Resistance Training   Training Prescription Yes    Weight 5 lb    Reps 10-15             Exercise Goals: Frequency: Be able to perform aerobic exercise two to three times per week in program working toward 2-5 days per week of home exercise.  Intensity: Work with a perceived exertion of 11 (fairly light) - 15 (hard) while following your exercise prescription.  We will make changes to your prescription with you as you progress through the program.   Duration: Be able to do 30 to 45 minutes of continuous aerobic exercise in addition to a 5 minute warm-up and a 5 minute cool-down routine.   Nutrition Goals: Your personal nutrition goals will be established  when you do your nutrition analysis with the dietician.  The following are general nutrition guidelines to follow: Cholesterol < 200mg /day Sodium < 1500mg /day Fiber: Men under 50 yrs - 38 grams per day  Personal Goals:  Personal Goals and Risk Factors at Admission - 03/10/21 1016       Core Components/Risk Factors/Patient Goals on Admission    Weight Management Yes;Weight Loss    Intervention Weight Management: Develop a combined nutrition and exercise program designed to reach desired caloric intake, while maintaining appropriate intake of nutrient and fiber, sodium and fats, and appropriate energy expenditure required for the weight goal.;Weight Management: Provide education and appropriate resources to help participant work on and attain dietary goals.;Weight Management/Obesity: Establish reasonable short term and long term weight goals.    Admit Weight 272 lb (123.4 kg)    Goal Weight: Short Term 265 lb (120.2 kg)    Goal Weight: Long Term 200 lb (90.7 kg)    Expected Outcomes Short Term: Continue to assess and modify interventions until short term weight is achieved;Long Term: Adherence to nutrition and physical activity/exercise  program aimed toward attainment of established weight goal;Weight Loss: Understanding of general recommendations for a balanced deficit meal plan, which promotes 1-2 lb weight loss per week and includes a negative energy balance of [phone] kcal/d;Understanding recommendations for meals to include 15-35% energy as protein, 25-35% energy from fat, 35-60% energy from carbohydrates, less than 200mg  of dietary cholesterol, 20-35 gm of total fiber daily;Understanding of distribution of calorie intake throughout the day with the consumption of 4-5 meals/snacks    Hypertension Yes    Intervention Provide education on lifestyle modifcations including regular physical activity/exercise, weight management, moderate sodium restriction and increased consumption of fresh fruit,  vegetables, and low fat dairy, alcohol moderation, and smoking cessation.;Monitor prescription use compliance.    Expected Outcomes Short Term: Continued assessment and intervention until BP is < 140/12mm HG in hypertensive participants. < 130/75mm HG in hypertensive participants with diabetes, heart failure or chronic kidney disease.;Long Term: Maintenance of blood pressure at goal levels.             Tobacco Use Initial Evaluation: Social History   Tobacco Use  Smoking Status Never  Smokeless Tobacco Never    Exercise Goals and Review:  Exercise Goals     Row Name 03/10/21 1015             Exercise Goals   Increase Physical Activity Yes       Intervention Provide advice, education, support and counseling about physical activity/exercise needs.;Develop an individualized exercise prescription for aerobic and resistive training based on initial evaluation findings, risk stratification, comorbidities and participant's personal goals.       Expected Outcomes Short Term: Attend rehab on a regular basis to increase amount of physical activity.;Long Term: Add in home exercise to make exercise part of routine and to increase amount of physical activity.;Long Term: Exercising regularly at least 3-5 days a week.       Increase Strength and Stamina Yes       Intervention Provide advice, education, support and counseling about physical activity/exercise needs.;Develop an individualized exercise prescription for aerobic and resistive training based on initial evaluation findings, risk stratification, comorbidities and participant's personal goals.       Expected Outcomes Short Term: Increase workloads from initial exercise prescription for resistance, speed, and METs.;Short Term: Perform resistance training exercises routinely during rehab and add in resistance training at home;Long Term: Improve cardiorespiratory fitness, muscular endurance and strength as measured by increased METs and  functional capacity (03/12/21)       Able to understand and use rate of perceived exertion (RPE) scale Yes       Intervention Provide education and explanation on how to use RPE scale       Expected Outcomes Short Term: Able to use RPE daily in rehab to express subjective intensity level;Long Term:  Able to use RPE to guide intensity level when exercising independently       Able to understand and use Dyspnea scale Yes       Intervention Provide education and explanation on how to use Dyspnea scale       Expected Outcomes Short Term: Able to use Dyspnea scale daily in rehab to express subjective sense of shortness of breath during exertion;Long Term: Able to use Dyspnea scale to guide intensity level when exercising independently       Knowledge and understanding of Target Heart Rate Range (THRR) Yes       Intervention Provide education and explanation of THRR including how the numbers were predicted and  where they are located for reference       Expected Outcomes Short Term: Able to state/look up THRR;Long Term: Able to use THRR to govern intensity when exercising independently;Short Term: Able to use daily as guideline for intensity in rehab       Able to check pulse independently Yes       Intervention Provide education and demonstration on how to check pulse in carotid and radial arteries.;Review the importance of being able to check your own pulse for safety during independent exercise       Expected Outcomes Short Term: Able to explain why pulse checking is important during independent exercise;Long Term: Able to check pulse independently and accurately       Understanding of Exercise Prescription Yes       Intervention Provide education, explanation, and written materials on patient's individual exercise prescription       Expected Outcomes Short Term: Able to explain program exercise prescription;Long Term: Able to explain home exercise prescription to exercise independently                 Copy of goals given to participant.

## 2021-03-17 ENCOUNTER — Encounter: Payer: Medicaid Other | Attending: Internal Medicine

## 2021-03-17 DIAGNOSIS — I252 Old myocardial infarction: Secondary | ICD-10-CM | POA: Insufficient documentation

## 2021-03-21 NOTE — Progress Notes (Deleted)
Cardiology Office Note:    Date:  03/21/2021   ID:  Peter Monroe, DOB [birth year], MRN [id number]  PCP:  Oswaldo Conroy, MD   [facility] Providers Cardiologist:  None { Click to update primary MD,subspecialty MD or APP then REFRESH:1}  ***  Referring MD: Oswaldo Conroy, MD   Chief Complaint:  No chief complaint on file.    Patient Profile:    Peter Monroe is a 20 y.o. male with:  Coronary artery disease  S/p NSTEMI 7/22 >> CTO of prox/mid LAD; med Rx due to late presentation/pt pain free  Ischemic cardiomyopathy Echocardiogram 7/22: EF 50-55 (Dr. Tenny Craw reviewed and thinks EF 40 w HK/AK of apex), mild to mod MR Diabetes mellitus  Hyperlipidemia  Morbid obesity    Prior CV studies:  LEFT HEART CATH AND CORONARY ANGIOGRAPHY 02/07/2021 Narrative Conclusions: Severe single-vessel coronary artery disease with occlusion of the proximal/mid LAD.  The mid/distal LAD fills via left to left and right to left collaterals. Minimal luminal irregularities noted in the RCA. Upper normal left ventricular filling pressure (LVEDP ~15 mmHg).  Recommendations: Aggressive secondary prevention, including 12 months of dual antiplatelet therapy and long-term high-intensity statin therapy. Consider viability and/or function study in a few weeks after the patient has recovered from acute MI to determine if there would be benefit from revascularization of LAD occlusion.  PCI not attempted today, as event began at least 72 hours ago with absence of symptoms for 48 hours and developing collaterals.   ECHOCARDIOGRAM 02/05/21  1. Left ventricular ejection fraction, by estimation, is 50 to 55%. The  left ventricle has low normal function. The left ventricle has no regional  wall motion abnormalities. There is severe concentric left ventricular  hypertrophy. Left ventricular  diastolic parameters are consistent with Grade II diastolic dysfunction  (pseudonormalization).   2. Right  ventricular systolic function is normal. The right ventricular  size is normal. There is normal pulmonary artery systolic pressure.   3. The mitral valve is normal in structure. Mild to moderate mitral valve  regurgitation. No evidence of mitral stenosis.   4. The aortic valve is normal in structure. Aortic valve regurgitation is  trivial. Mild aortic valve sclerosis is present, with no evidence of  aortic valve stenosis.   5. The inferior vena cava is normal in size with greater than 50%  respiratory variability, suggesting right atrial pressure of 3 mmHg.    History of Present Illness: Mr. Goldfarb was seen for post hospital f/u by Dr. Tenny Craw in 8/22.  He had been admitted to [facility] with a NSTEMI in 01/2021.  His cardiac catheterization demonstrated a CTO of the prox to mid LAD.  His event began 72 hours prior to his cath but was pain free for 48 hours.  Med Rx was recommended.  When he saw Dr. Tenny Craw, she felt that he likely did not have an acute occlusion as this would have likely caused a STEMI and VF.  His risk factors also did not seem to be enough to predispose such a young patient to this type of problem.  There was a question of a viral illness earlier in the year.  She recommended a CMR to evaluate for viability.  CMR has not been arranged yet.  He returns for f/u.  ***       No past medical history on file.  Current Medications: No outpatient medications have been marked as taking for the 03/22/21 encounter (Appointment) with Tereso Newcomer T, PA-C.  Allergies:   Patient has no known allergies.   Social History   Tobacco Use   Smoking status: Never   Smokeless tobacco: Never  Substance Use Topics   Alcohol use: No    Alcohol/week: 0.0 standard drinks   Drug use: No     Family Hx: The patient's family history is not on file.  ROS   EKGs/Labs/Other Test Reviewed:    EKG:  EKG is *** ordered today.  The ekg ordered today demonstrates ***  Recent Labs: 02/05/2021: TSH  1.451 02/21/2021: BUN 9; Creatinine, Ser 0.94; Hemoglobin 13.0; Platelets 545; Potassium 4.5; Sodium 141   Recent Lipid Panel Lab Results  Component Value Date/Time   CHOL 172 02/05/2021 07:12 AM   TRIG 118 02/05/2021 07:12 AM   HDL 26 (L) 02/05/2021 07:12 AM   LDLCALC 122 (H) 02/05/2021 07:12 AM      Risk Assessment/Calculations:   {Does this patient have ATRIAL FIBRILLATION?:[phone]}      Physical Exam:    VS:  There were no vitals taken for this visit.    Wt Readings from Last 3 Encounters:  03/10/21 272 lb 12.8 oz (123.7 kg)  02/21/21 266 lb (120.7 kg)  02/08/21 260 lb (117.9 kg)     Physical Exam ***     ASSESSMENT & PLAN:    {Select for Dx:25819}    {Are you ordering a CV Procedure (e.g. stress test, cath, DCCV, TEE, etc)?   Press F2        :[id number]}    Dispo:  No follow-ups on file.   Medication Adjustments/Labs and Tests Ordered: Current medicines are reviewed at length with the patient today.  Concerns regarding medicines are outlined above.  Tests Ordered: No orders of the defined types were placed in this encounter.  Medication Changes: No orders of the defined types were placed in this encounter.   Signed, Tereso Newcomer, PA-C  03/21/2021 9:28 PM    [facility] [address] [place], [place], [place]  [address] Phone: [phone]; Fax: [phone]

## 2021-03-22 ENCOUNTER — Ambulatory Visit: Payer: Medicaid Other | Admitting: Physician Assistant

## 2021-03-22 DIAGNOSIS — E782 Mixed hyperlipidemia: Secondary | ICD-10-CM

## 2021-03-22 DIAGNOSIS — R7301 Impaired fasting glucose: Secondary | ICD-10-CM

## 2021-03-22 DIAGNOSIS — I2510 Atherosclerotic heart disease of native coronary artery without angina pectoris: Secondary | ICD-10-CM

## 2021-03-22 DIAGNOSIS — E669 Obesity, unspecified: Secondary | ICD-10-CM

## 2021-03-22 DIAGNOSIS — I10 Essential (primary) hypertension: Secondary | ICD-10-CM

## 2021-03-23 ENCOUNTER — Encounter: Payer: Self-pay | Admitting: *Deleted

## 2021-03-23 DIAGNOSIS — I214 Non-ST elevation (NSTEMI) myocardial infarction: Secondary | ICD-10-CM

## 2021-03-23 NOTE — Progress Notes (Signed)
Cardiac Individual Treatment Plan  Patient Details  Name: Peter Monroe MRN: [id number] Date of Birth: [birth year] Referring Provider:   Flowsheet Row Cardiac Rehab from 03/10/2021 in [facility] Cardiac and Pulmonary Rehab  Referring Provider Dietrich Pates MD       Initial Encounter Date:  Flowsheet Row Cardiac Rehab from 03/10/2021 in [facility] Cardiac and Pulmonary Rehab  Date 03/10/21       Visit Diagnosis: NSTEMI (non-ST elevation myocardial infarction) [facility])  Patient's Home Medications on Admission:  Current Outpatient Medications:    aspirin EC 81 MG EC tablet, Take 1 tablet (81 mg total) by mouth daily. Swallow whole., Disp: 30 tablet, Rfl: 0   atorvastatin (LIPITOR) 80 MG tablet, Take 1 tablet (80 mg total) by mouth daily., Disp: 30 tablet, Rfl: 0   clopidogrel (PLAVIX) 75 MG tablet, Take 1 tablet (75 mg total) by mouth daily with breakfast., Disp: 30 tablet, Rfl: 0   losartan (COZAAR) 25 MG tablet, Take 1 tablet (25 mg total) by mouth daily., Disp: 90 tablet, Rfl: 3   metoprolol succinate (TOPROL-XL) 25 MG 24 hr tablet, Take 1 tablet (25 mg total) by mouth daily., Disp: 30 tablet, Rfl: 0   vitamin B-12 1000 MCG tablet, Take 1 tablet (1,000 mcg total) by mouth daily., Disp: 30 tablet, Rfl: 0  Past Medical History: No past medical history on file.  Tobacco Use: Social History   Tobacco Use  Smoking Status Never  Smokeless Tobacco Never    Labs: Recent Review Flowsheet Data     Labs for ITP Cardiac and Pulmonary Rehab Latest Ref Rng & Units 02/05/2021   Cholestrol 0 - 200 mg/dL 193   LDLCALC 0 - 99 mg/dL 790(W)   HDL >40 mg/dL 97(D)   Trlycerides <532 mg/dL 992   Hemoglobin E2A 4.8 - 5.6 % 6.3(H)        Exercise Target Goals: Exercise Program Goal: Individual exercise prescription set using results from initial 6 min walk test and THRR while considering  patient's activity barriers and safety.   Exercise Prescription Goal: Initial exercise prescription builds to  30-45 minutes a day of aerobic activity, 2-3 days per week.  Home exercise guidelines will be given to patient during program as part of exercise prescription that the participant will acknowledge.   Education: Aerobic Exercise: - Group verbal and visual presentation on the components of exercise prescription. Introduces F.I.T.T principle from ACSM for exercise prescriptions.  Reviews F.I.T.T. principles of aerobic exercise including progression. Written material given at graduation.   Education: Resistance Exercise: - Group verbal and visual presentation on the components of exercise prescription. Introduces F.I.T.T principle from ACSM for exercise prescriptions  Reviews F.I.T.T. principles of resistance exercise including progression. Written material given at graduation.    Education: Exercise & Equipment Safety: - Individual verbal instruction and demonstration of equipment use and safety with use of the equipment. Flowsheet Row Cardiac Rehab from 03/10/2021 in [facility] Cardiac and Pulmonary Rehab  Education need identified 03/10/21  Date 03/10/21  Educator KL  Instruction Review Code 1- Verbalizes Understanding       Education: Exercise Physiology & General Exercise Guidelines: - Group verbal and written instruction with models to review the exercise physiology of the cardiovascular system and associated critical values. Provides general exercise guidelines with specific guidelines to those with heart or lung disease.  Flowsheet Row Cardiac Rehab from 03/10/2021 in [facility] Cardiac and Pulmonary Rehab  Education need identified 03/10/21       Education: Flexibility, Balance, Mind/Body Relaxation: -  Group verbal and visual presentation with interactive activity on the components of exercise prescription. Introduces F.I.T.T principle from ACSM for exercise prescriptions. Reviews F.I.T.T. principles of flexibility and balance exercise training including progression. Also discusses the mind  body connection.  Reviews various relaxation techniques to help reduce and manage stress (i.e. Deep breathing, progressive muscle relaxation, and visualization). Balance handout provided to take home. Written material given at graduation.   Activity Barriers & Risk Stratification:  Activity Barriers & Cardiac Risk Stratification - 03/10/21 0953       Activity Barriers & Cardiac Risk Stratification   Activity Barriers None    Cardiac Risk Stratification High             6 Minute Walk:  6 Minute Walk     Row Name 03/10/21 0952         6 Minute Walk   Phase Initial     Distance 1040 feet     Walk Time 6 minutes     # of Rest Breaks 0     MPH 1.96     METS 4.62     RPE 7     Perceived Dyspnea  0     VO2 Peak 16.02     Symptoms No     Resting HR 68 bpm     Resting BP 138/78     Resting Oxygen Saturation  97 %     Exercise Oxygen Saturation  during 6 min walk 98 %     Max Ex. HR 97 bpm     Max Ex. BP 146/84     2 Minute Post BP 140/80              Oxygen Initial Assessment:   Oxygen Re-Evaluation:   Oxygen Discharge (Final Oxygen Re-Evaluation):   Initial Exercise Prescription:  Initial Exercise Prescription - 03/10/21 1000       Date of Initial Exercise RX and Referring Provider   Date 03/10/21    Referring Provider Dietrich Pates MD      Treadmill   MPH 2.2    Grade 1    Minutes 15    METs 2.99      REL-XR   Level 3    Speed 50    Minutes 15    METs 4.6      T5 Nustep   Level 2    SPM 80    Minutes 15    METs 4.6      Track   Laps 28    Minutes 15    METs 2.52      Prescription Details   Frequency (times per week) 2    Duration Progress to 30 minutes of continuous aerobic without signs/symptoms of physical distress      Intensity   THRR 40-80% of Max Heartrate 120-173    Ratings of Perceived Exertion 11-13    Perceived Dyspnea 0-4      Progression   Progression Continue to progress workloads to maintain intensity without  signs/symptoms of physical distress.      Resistance Training   Training Prescription Yes    Weight 5 lb    Reps 10-15             Perform Capillary Blood Glucose checks as needed.  Exercise Prescription Changes:   Exercise Prescription Changes     Row Name 03/10/21 1000             Response to Exercise   Blood Pressure (  Admit) 138/78       Blood Pressure (Exercise) 146/84       Blood Pressure (Exit) 140/80       Heart Rate (Admit) 68 bpm       Heart Rate (Exercise) 97 bpm       Heart Rate (Exit) 70 bpm       Oxygen Saturation (Admit) 97 %       Oxygen Saturation (Exercise) 98 %       Oxygen Saturation (Exit) 98 %       Rating of Perceived Exertion (Exercise) 7       Perceived Dyspnea (Exercise) 0       Symptoms none       Comments walk test results                Exercise Comments:   Exercise Goals and Review:   Exercise Goals     Row Name 03/10/21 1015             Exercise Goals   Increase Physical Activity Yes       Intervention Provide advice, education, support and counseling about physical activity/exercise needs.;Develop an individualized exercise prescription for aerobic and resistive training based on initial evaluation findings, risk stratification, comorbidities and participant's personal goals.       Expected Outcomes Short Term: Attend rehab on a regular basis to increase amount of physical activity.;Long Term: Add in home exercise to make exercise part of routine and to increase amount of physical activity.;Long Term: Exercising regularly at least 3-5 days a week.       Increase Strength and Stamina Yes       Intervention Provide advice, education, support and counseling about physical activity/exercise needs.;Develop an individualized exercise prescription for aerobic and resistive training based on initial evaluation findings, risk stratification, comorbidities and participant's personal goals.       Expected Outcomes Short Term:  Increase workloads from initial exercise prescription for resistance, speed, and METs.;Short Term: Perform resistance training exercises routinely during rehab and add in resistance training at home;Long Term: Improve cardiorespiratory fitness, muscular endurance and strength as measured by increased METs and functional capacity (6MWT)       Able to understand and use rate of perceived exertion (RPE) scale Yes       Intervention Provide education and explanation on how to use RPE scale       Expected Outcomes Short Term: Able to use RPE daily in rehab to express subjective intensity level;Long Term:  Able to use RPE to guide intensity level when exercising independently       Able to understand and use Dyspnea scale Yes       Intervention Provide education and explanation on how to use Dyspnea scale       Expected Outcomes Short Term: Able to use Dyspnea scale daily in rehab to express subjective sense of shortness of breath during exertion;Long Term: Able to use Dyspnea scale to guide intensity level when exercising independently       Knowledge and understanding of Target Heart Rate Range (THRR) Yes       Intervention Provide education and explanation of THRR including how the numbers were predicted and where they are located for reference       Expected Outcomes Short Term: Able to state/look up THRR;Long Term: Able to use THRR to govern intensity when exercising independently;Short Term: Able to use daily as guideline for intensity in rehab  Able to check pulse independently Yes       Intervention Provide education and demonstration on how to check pulse in carotid and radial arteries.;Review the importance of being able to check your own pulse for safety during independent exercise       Expected Outcomes Short Term: Able to explain why pulse checking is important during independent exercise;Long Term: Able to check pulse independently and accurately       Understanding of Exercise  Prescription Yes       Intervention Provide education, explanation, and written materials on patient's individual exercise prescription       Expected Outcomes Short Term: Able to explain program exercise prescription;Long Term: Able to explain home exercise prescription to exercise independently                Exercise Goals Re-Evaluation :   Discharge Exercise Prescription (Final Exercise Prescription Changes):  Exercise Prescription Changes - 03/10/21 1000       Response to Exercise   Blood Pressure (Admit) 138/78    Blood Pressure (Exercise) 146/84    Blood Pressure (Exit) 140/80    Heart Rate (Admit) 68 bpm    Heart Rate (Exercise) 97 bpm    Heart Rate (Exit) 70 bpm    Oxygen Saturation (Admit) 97 %    Oxygen Saturation (Exercise) 98 %    Oxygen Saturation (Exit) 98 %    Rating of Perceived Exertion (Exercise) 7    Perceived Dyspnea (Exercise) 0    Symptoms none    Comments walk test results             Nutrition:  Target Goals: Understanding of nutrition guidelines, daily intake of sodium 1500mg , cholesterol 200mg , calories 30% from fat and 7% or less from saturated fats, daily to have 5 or more servings of fruits and vegetables.  Education: All About Nutrition: -Group instruction provided by verbal, written material, interactive activities, discussions, models, and posters to present general guidelines for heart healthy nutrition including fat, fiber, MyPlate, the role of sodium in heart healthy nutrition, utilization of the nutrition label, and utilization of this knowledge for meal planning. Follow up email sent as well. Written material given at graduation. Flowsheet Row Cardiac Rehab from 03/10/2021 in [facility] Cardiac and Pulmonary Rehab  Education need identified 03/10/21       Biometrics:  Pre Biometrics - 03/10/21 0953       Pre Biometrics   Height 5\' 11"  (1.803 m)    Weight 272 lb 12.8 oz (123.7 kg)    BMI (Calculated) 38.06               Nutrition Therapy Plan and Nutrition Goals:  Nutrition Therapy & Goals - 03/10/21 1025       Intervention Plan   Intervention Prescribe, educate and counsel regarding individualized specific dietary modifications aiming towards targeted core components such as weight, hypertension, lipid management, diabetes, heart failure and other comorbidities.    Expected Outcomes Short Term Goal: Understand basic principles of dietary content, such as calories, fat, sodium, cholesterol and nutrients.;Short Term Goal: A plan has been developed with personal nutrition goals set during dietitian appointment.;Long Term Goal: Adherence to prescribed nutrition plan.             Nutrition Assessments:  MEDIFICTS Score Key: ?70 Need to make dietary changes  40-70 Heart Healthy Diet ? 40 Therapeutic Level Cholesterol Diet  Flowsheet Row Cardiac Rehab from 03/10/2021 in [facility] Cardiac and Pulmonary Rehab  Picture Your Plate  Total Score on Admission 59      Picture Your Plate Scores: <26 Unhealthy dietary pattern with much room for improvement. 41-50 Dietary pattern unlikely to meet recommendations for good health and room for improvement. 51-60 More healthful dietary pattern, with some room for improvement.  >60 Healthy dietary pattern, although there may be some specific behaviors that could be improved.    Nutrition Goals Re-Evaluation:   Nutrition Goals Discharge (Final Nutrition Goals Re-Evaluation):   Psychosocial: Target Goals: Acknowledge presence or absence of significant depression and/or stress, maximize coping skills, provide positive support system. Participant is able to verbalize types and ability to use techniques and skills needed for reducing stress and depression.   Education: Stress, Anxiety, and Depression - Group verbal and visual presentation to define topics covered.  Reviews how body is impacted by stress, anxiety, and depression.  Also discusses healthy ways to  reduce stress and to treat/manage anxiety and depression.  Written material given at graduation.   Education: Sleep Hygiene -Provides group verbal and written instruction about how sleep can affect your health.  Define sleep hygiene, discuss sleep cycles and impact of sleep habits. Review good sleep hygiene tips.    Initial Review & Psychosocial Screening:  Initial Psych Review & Screening - 02/18/21 1305       Initial Review   Current issues with None Identified      Family Dynamics   Good Support System? Yes   parents, girlfriend     Barriers   Psychosocial barriers to participate in program There are no identifiable barriers or psychosocial needs.;The patient should benefit from training in stress management and relaxation.      Screening Interventions   Interventions Encouraged to exercise;Provide feedback about the scores to participant;To provide support and resources with identified psychosocial needs    Expected Outcomes Short Term goal: Utilizing psychosocial counselor, staff and physician to assist with identification of specific Stressors or current issues interfering with healing process. Setting desired goal for each stressor or current issue identified.;Short Term goal: Identification and review with participant of any Quality of Life or Depression concerns found by scoring the questionnaire.;Long Term Goal: Stressors or current issues are controlled or eliminated.;Long Term goal: The participant improves quality of Life and PHQ9 Scores as seen by post scores and/or verbalization of changes             Quality of Life Scores:   Quality of Life - 03/10/21 1017       Quality of Life   Select Quality of Life      Quality of Life Scores   Health/Function Pre 27.83 %    Socioeconomic Pre 27.75 %    Psych/Spiritual Pre 27.93 %    Family Pre 27.6 %    GLOBAL Pre 27.8 %            Scores of 19 and below usually indicate a poorer quality of life in these areas.   A difference of  2-3 points is a clinically meaningful difference.  A difference of 2-3 points in the total score of the Quality of Life Index has been associated with significant improvement in overall quality of life, self-image, physical symptoms, and general health in studies assessing change in quality of life.  PHQ-9: Recent Review Flowsheet Data     Depression screen [facility] 2/9 03/10/2021   Decreased Interest 0   Down, Depressed, Hopeless 0   PHQ - 2 Score 0   Altered sleeping 1   Tired,  decreased energy 0   Change in appetite 1   Feeling bad or failure about yourself  1   Trouble concentrating 0   Moving slowly or fidgety/restless 0   Suicidal thoughts 0   PHQ-9 Score 3   Difficult doing work/chores Not difficult at all      Interpretation of Total Score  Total Score Depression Severity:  1-4 = Minimal depression, 5-9 = Mild depression, 10-14 = Moderate depression, 15-19 = Moderately severe depression, 20-27 = Severe depression   Psychosocial Evaluation and Intervention:  Psychosocial Evaluation - 02/18/21 1310       Psychosocial Evaluation & Interventions   Interventions Encouraged to exercise with the program and follow exercise prescription    Comments Horace is coming to cardiac rehab after having a NSTEMI. It did come as a suprise to him as he is young, but states he is handling it well. He is motivated to take care of his body and manage his health. His parents and girlfriend are his support system. He had just gotten a job at [employer] when his MI happened, so he is hoping to find another job soon.    Expected Outcomes Short: attend cardiac rehab for education and exercise. Long: develop positive self care habits.    Continue Psychosocial Services  Follow up required by staff             Psychosocial Re-Evaluation:   Psychosocial Discharge (Final Psychosocial Re-Evaluation):   Vocational Rehabilitation: Provide vocational rehab assistance to qualifying  candidates.   Vocational Rehab Evaluation & Intervention:   Education: Education Goals: Education classes will be provided on a variety of topics geared toward better understanding of heart health and risk factor modification. Participant will state understanding/return demonstration of topics presented as noted by education test scores.  Learning Barriers/Preferences:  Learning Barriers/Preferences - 02/18/21 1305       Learning Barriers/Preferences   Learning Barriers None    Learning Preferences None             General Cardiac Education Topics:  AED/CPR: - Group verbal and written instruction with the use of models to demonstrate the basic use of the AED with the basic ABC's of resuscitation.   Anatomy and Cardiac Procedures: - Group verbal and visual presentation and models provide information about basic cardiac anatomy and function. Reviews the testing methods done to diagnose heart disease and the outcomes of the test results. Describes the treatment choices: Medical Management, Angioplasty, or Coronary Bypass Surgery for treating various heart conditions including Myocardial Infarction, Angina, Valve Disease, and Cardiac Arrhythmias.  Written material given at graduation.   Medication Safety: - Group verbal and visual instruction to review commonly prescribed medications for heart and lung disease. Reviews the medication, class of the drug, and side effects. Includes the steps to properly store meds and maintain the prescription regimen.  Written material given at graduation.   Intimacy: - Group verbal instruction through game format to discuss how heart and lung disease can affect sexual intimacy. Written material given at graduation..   Know Your Numbers and Heart Failure: - Group verbal and visual instruction to discuss disease risk factors for cardiac and pulmonary disease and treatment options.  Reviews associated critical values for Overweight/Obesity,  Hypertension, Cholesterol, and Diabetes.  Discusses basics of heart failure: signs/symptoms and treatments.  Introduces Heart Failure Zone chart for action plan for heart failure.  Written material given at graduation. Flowsheet Row Cardiac Rehab from 03/10/2021 in [facility] Cardiac and Pulmonary Rehab  Education need identified 03/10/21       Infection Prevention: - Provides verbal and written material to individual with discussion of infection control including proper hand washing and proper equipment cleaning during exercise session. Flowsheet Row [facility]  Education need identified 03/10/21  Date 03/10/21  Educator KL  Instruction Review Code 1- Verbalizes Understanding       Falls Prevention: - Provides verbal and written material to individual with discussion of falls prevention and safety. Flowsheet [facility]  Education need identified 03/10/21  Date 03/10/21  Educator KL  Instruction Review Code 1- Verbalizes Understanding       Other: -Provides group and verbal instruction on various topics (see comments)   Knowledge Questionnaire Score:  Knowledge Questionnaire Score - 03/10/21 0926       Knowledge Questionnaire Score   Pre Score 18/26: Nutrition, Exercise, MI. HF             Core Components/Risk Factors/Patient Goals at Admission:  Personal Goals and Risk Factors at Admission - 03/10/21 1016       Core Components/Risk Factors/Patient Goals on Admission    Weight Management Yes;Weight Loss    Intervention Weight Management: Develop a combined nutrition and exercise program designed to reach desired caloric intake, while maintaining appropriate intake of nutrient and fiber, sodium and fats, and appropriate energy expenditure required for the weight goal.;Weight Management: Provide education and appropriate resources to help participant work on and  attain dietary goals.;Weight Management/Obesity: Establish reasonable short term and long term weight goals.    Admit Weight 272 lb (123.4 kg)    Goal Weight: Short Term 265 lb (120.2 kg)    Goal Weight: Long Term 200 lb (90.7 kg)    Expected Outcomes Short Term: Continue to assess and modify interventions until short term weight is achieved;Long Term: Adherence to nutrition and physical activity/exercise program aimed toward attainment of established weight goal;Weight Loss: Understanding of general recommendations for a balanced deficit meal plan, which promotes 1-2 lb weight loss per week and includes a negative energy balance of [phone] kcal/d;Understanding recommendations for meals to include 15-35% energy as protein, 25-35% energy from fat, 35-60% energy from carbohydrates, less than 200mg  of dietary cholesterol, 20-35 gm of total fiber daily;Understanding of distribution of calorie intake throughout the day with the consumption of 4-5 meals/snacks    Hypertension Yes    Intervention Provide education on lifestyle modifcations including regular physical activity/exercise, weight management, moderate sodium restriction and increased consumption of fresh fruit, vegetables, and low fat dairy, alcohol moderation, and smoking cessation.;Monitor prescription use compliance.    Expected Outcomes Short Term: Continued assessment and intervention until BP is < 140/6890mm HG in hypertensive participants. < 130/480mm HG in hypertensive participants with diabetes, heart failure or chronic kidney disease.;Long Term: Maintenance of blood pressure at goal levels.             Education:Diabetes - Individual verbal and written instruction to review signs/symptoms of diabetes, desired ranges of glucose level fasting, after meals and with exercise. Acknowledge that pre and post exercise glucose checks will be done for 3 sessions at entry of program.   Core Components/Risk Factors/Patient Goals Review:    Core  Components/Risk Factors/Patient Goals at Discharge (Final Review):    ITP Comments:  ITP Comments     Row Name 02/18/21 1309 03/10/21 0921 03/23/21 0736       ITP Comments Initial  telephone orientation completed. Diagnosis [facility] in [facility] 7/[facility]. EP orientation scheduled for Thursday 8/11 at 10am. Completed and gym orientation. Initial ITP created and sent for review to Dr. Bethann Punches, Medical Director. 30 Day review completed. Medical Director ITP review done, changes made as directed, and signed approval by Medical Director.              Comments:

## 2021-03-24 ENCOUNTER — Telehealth: Payer: Self-pay

## 2021-03-24 DIAGNOSIS — I214 Non-ST elevation (NSTEMI) myocardial infarction: Secondary | ICD-10-CM

## 2021-03-24 NOTE — Telephone Encounter (Signed)
Patient called to say he was in contact with someone who had COVID.He was not here this morning for that reason. He is going to take another test on Monday to see if he has another negative test. He states that it has been about a week already since his encounter.

## 2021-03-24 NOTE — Telephone Encounter (Signed)
Attempted to call patient as he has not showed up for any initial exercise sessions for Cardiac Rehab. Called patient and left message asking for call back.

## 2021-03-30 ENCOUNTER — Telehealth: Payer: Self-pay | Admitting: *Deleted

## 2021-03-30 MED ORDER — CLOPIDOGREL BISULFATE 75 MG PO TABS: 75.0000 mg | ORAL_TABLET | Freq: Every day | ORAL | 3 refills | Status: AC

## 2021-03-30 MED ORDER — ATORVASTATIN CALCIUM 80 MG PO TABS: 80.0000 mg | ORAL_TABLET | Freq: Every day | ORAL | 3 refills | Status: AC

## 2021-03-30 MED ORDER — METOPROLOL SUCCINATE ER 25 MG PO TB24: 25.0000 mg | ORAL_TABLET | Freq: Every day | ORAL | 3 refills | Status: AC

## 2021-03-30 NOTE — Telephone Encounter (Signed)
Pricilla Riffle, MD  Lendon Ka, RN Patient needs cardiac MRI I agree     I sent him a text about this      1.   Make appt with me in October  2 Needs MRI  3  He  wrote me saying he needs refills on meds    OK to fill for a couple months but then he needs to get seen            Previous Messages   ----- Message -----  From: Lendon Ka, RN  Sent: 03/28/2021   1:09 PM EDT  To: Pricilla Riffle, MD  Subject: FW: cardiac mri; Dx: cardiomyopathy             Dr. Tenny Craw,  I wanted to let you know I've been watching to see when this is being scheduled.  The pt did not answer his phone for the scheduling calls.  Also, he is marked as no show for several appointments at cardiac rehab as well as follow up with Scott.    Keonda Dow   __________________________________________________________________________________ Jeanene Erb pt, left VM that his medications have been refilled to [facility] in [place] and to please call me to schedule an appointment with Dr. Tenny Craw, still needs cardiac MRI arranged.

## 2021-03-31 ENCOUNTER — Telehealth: Payer: Self-pay

## 2021-03-31 DIAGNOSIS — I214 Non-ST elevation (NSTEMI) myocardial infarction: Secondary | ICD-10-CM

## 2021-03-31 NOTE — Telephone Encounter (Signed)
Called patient again regarding Cardiac Rehab attendance. He took a Covid test and it was negative. He is feeling much better, denies other symptoms and plans to start rehab next Tuesday, 9/20.

## 2021-04-05 NOTE — Telephone Encounter (Signed)
No show for cardiac rehab on 9/20. Sent letter via [employer]. Will discharge if no response.

## 2021-04-07 NOTE — Telephone Encounter (Signed)
Patient returning call for Peter Monroe. Patient was provided the number to the central imaging scheduling to schedule his MRI

## 2021-04-14 DIAGNOSIS — I214 Non-ST elevation (NSTEMI) myocardial infarction: Secondary | ICD-10-CM

## 2021-04-14 NOTE — Progress Notes (Signed)
Discharge Progress Report  Patient Details  Name: Peter Monroe MRN: [id number] Date of Birth: August 23, 2000 Referring Provider:   Flowsheet Row Cardiac Rehab from 03/10/2021 in [facility]  Referring Provider Dietrich Pates MD        Number of Visits: 1  Reason for Discharge:  Early Exit:  Lack of attendance  Smoking History:  Social History   Tobacco Use  Smoking Status Never  Smokeless Tobacco Never    Diagnosis:  NSTEMI (non-ST elevation myocardial infarction) [facility])  ADL [facility]:   Initial Exercise Prescription:  Initial Exercise Prescription - 03/10/21 1000       Date of Initial Exercise RX and Referring Provider   Date 03/10/21    Referring Provider Dietrich Pates MD      Treadmill   MPH 2.2    Grade 1    Minutes 15    METs 2.99      REL-XR   Level 3    Speed 50    Minutes 15    METs 4.6      T5 Nustep   Level 2    SPM 80    Minutes 15    METs 4.6      Track   Laps 28    Minutes 15    METs 2.52      Prescription Details   Frequency (times per week) 2    Duration Progress to 30 minutes of continuous aerobic without signs/symptoms of physical distress      Intensity   THRR 40-80% of Max Heartrate 120-173    Ratings of Perceived Exertion 11-13    Perceived Dyspnea 0-4      Progression   Progression Continue to progress workloads to maintain intensity without signs/symptoms of physical distress.      Resistance Training   Training Prescription Yes    Weight 5 lb    Reps 10-15             Discharge Exercise Prescription (Final Exercise Prescription Changes):  Exercise Prescription Changes - 03/10/21 1000       Response to Exercise   Blood Pressure (Admit) 138/78    Blood Pressure (Exercise) 146/84    Blood Pressure (Exit) 140/80    Heart Rate (Admit) 68 bpm    Heart Rate (Exercise) 97 bpm    Heart Rate (Exit) 70 bpm    Oxygen Saturation (Admit) 97 %    Oxygen Saturation (Exercise) 98 %    Oxygen Saturation  (Exit) 98 %    Rating of Perceived Exertion (Exercise) 7    Perceived Dyspnea (Exercise) 0    Symptoms none    Comments walk test results             Functional Capacity:  6 Minute Walk     Row Name 03/10/21 0952         6 Minute Walk   Phase Initial     Distance 1040 feet     Walk Time 6 minutes     # of Rest Breaks 0     MPH 1.96     METS 4.62     RPE 7     Perceived Dyspnea  0     VO2 Peak 16.02     Symptoms No     Resting HR 68 bpm     Resting BP 138/78     Resting Oxygen Saturation  97 %     Exercise Oxygen Saturation  during 6  min walk 98 %     Max Ex. HR 97 bpm     Max Ex. BP 146/84     2 Minute Post BP 140/80              Nutrition & Weight - Outcomes:  Pre Biometrics - 03/10/21 0953       Pre Biometrics   Height 5\' 11"  (1.803 m)    Weight 272 lb 12.8 oz (123.7 kg)    BMI (Calculated) 38.06             Nutrition:  Nutrition Therapy & Goals - 03/10/21 1025       Intervention Plan   Intervention Prescribe, educate and counsel regarding individualized specific dietary modifications aiming towards targeted core components such as weight, hypertension, lipid management, diabetes, heart failure and other comorbidities.    Expected Outcomes Short Term Goal: Understand basic principles of dietary content, such as calories, fat, sodium, cholesterol and nutrients.;Short Term Goal: A plan has been developed with personal nutrition goals set during dietitian appointment.;Long Term Goal: Adherence to prescribed nutrition plan.             Goals reviewed with patient; copy given to patient.

## 2021-04-14 NOTE — Progress Notes (Signed)
Cardiac Individual Treatment Plan  Patient Details  Name: Peter Monroe MRN: [id number] Date of Birth: 07-05-2001 Referring Provider:   Flowsheet Row Cardiac Rehab from 03/10/2021 in [facility] Cardiac and Pulmonary Rehab  Referring Provider Dietrich Pates MD       Initial Encounter Date:  Flowsheet Row Cardiac Rehab from 03/10/2021 in [facility] Cardiac and Pulmonary Rehab  Date 03/10/21       Visit Diagnosis: NSTEMI (non-ST elevation myocardial infarction) [facility])  Patient's Home Medications on Admission:  Current Outpatient Medications:    aspirin EC 81 MG EC tablet, Take 1 tablet (81 mg total) by mouth daily. Swallow whole., Disp: 30 tablet, Rfl: 0   atorvastatin (LIPITOR) 80 MG tablet, Take 1 tablet (80 mg total) by mouth daily., Disp: 90 tablet, Rfl: 3   clopidogrel (PLAVIX) 75 MG tablet, Take 1 tablet (75 mg total) by mouth daily with breakfast., Disp: 90 tablet, Rfl: 3   losartan (COZAAR) 25 MG tablet, Take 1 tablet (25 mg total) by mouth daily., Disp: 90 tablet, Rfl: 3   metoprolol succinate (TOPROL-XL) 25 MG 24 hr tablet, Take 1 tablet (25 mg total) by mouth daily., Disp: 90 tablet, Rfl: 3   vitamin B-12 1000 MCG tablet, Take 1 tablet (1,000 mcg total) by mouth daily., Disp: 30 tablet, Rfl: 0  Past Medical History: No past medical history on file.  Tobacco Use: Social History   Tobacco Use  Smoking Status Never  Smokeless Tobacco Never    Labs: Recent Review Flowsheet Data     Labs for ITP Cardiac and Pulmonary Rehab Latest Ref Rng & Units 02/05/2021   Cholestrol 0 - 200 mg/dL 614   LDLCALC 0 - 99 mg/dL 431(V)   HDL >40 mg/dL 08(Q)   Trlycerides <761 mg/dL 950   Hemoglobin D3O 4.8 - 5.6 % 6.3(H)        Exercise Target Goals: Exercise Program Goal: Individual exercise prescription set using results from initial 6 min walk test and THRR while considering  patient's activity barriers and safety.   Exercise Prescription Goal: Initial exercise prescription builds to  30-45 minutes a day of aerobic activity, 2-3 days per week.  Home exercise guidelines will be given to patient during program as part of exercise prescription that the participant will acknowledge.   Education: Aerobic Exercise: - Group verbal and visual presentation on the components of exercise prescription. Introduces F.I.T.T principle from ACSM for exercise prescriptions.  Reviews F.I.T.T. principles of aerobic exercise including progression. Written material given at graduation.   Education: Resistance Exercise: - Group verbal and visual presentation on the components of exercise prescription. Introduces F.I.T.T principle from ACSM for exercise prescriptions  Reviews F.I.T.T. principles of resistance exercise including progression. Written material given at graduation.    Education: Exercise & Equipment Safety: - Individual verbal instruction and demonstration of equipment use and safety with use of the equipment. Flowsheet Row Cardiac Rehab from 03/10/2021 in [facility] Cardiac and Pulmonary Rehab  Education need identified 03/10/21  Date 03/10/21  Educator KL  Instruction Review Code 1- Verbalizes Understanding       Education: Exercise Physiology & General Exercise Guidelines: - Group verbal and written instruction with models to review the exercise physiology of the cardiovascular system and associated critical values. Provides general exercise guidelines with specific guidelines to those with heart or lung disease.  Flowsheet Row Cardiac Rehab from 03/10/2021 in [facility] Cardiac and Pulmonary Rehab  Education need identified 03/10/21       Education: Flexibility, Balance, Mind/Body Relaxation: -  Group verbal and visual presentation with interactive activity on the components of exercise prescription. Introduces F.I.T.T principle from ACSM for exercise prescriptions. Reviews F.I.T.T. principles of flexibility and balance exercise training including progression. Also discusses the mind  body connection.  Reviews various relaxation techniques to help reduce and manage stress (i.e. Deep breathing, progressive muscle relaxation, and visualization). Balance handout provided to take home. Written material given at graduation.   Activity Barriers & Risk Stratification:  Activity Barriers & Cardiac Risk Stratification - 03/10/21 0953       Activity Barriers & Cardiac Risk Stratification   Activity Barriers None    Cardiac Risk Stratification High             6 Minute Walk:  6 Minute Walk     Row Name 03/10/21 0952         6 Minute Walk   Phase Initial     Distance 1040 feet     Walk Time 6 minutes     # of Rest Breaks 0     MPH 1.96     METS 4.62     RPE 7     Perceived Dyspnea  0     VO2 Peak 16.02     Symptoms No     Resting HR 68 bpm     Resting BP 138/78     Resting Oxygen Saturation  97 %     Exercise Oxygen Saturation  during 6 min walk 98 %     Max Ex. HR 97 bpm     Max Ex. BP 146/84     2 Minute Post BP 140/80              Oxygen Initial Assessment:   Oxygen Re-Evaluation:   Oxygen Discharge (Final Oxygen Re-Evaluation):   Initial Exercise Prescription:  Initial Exercise Prescription - 03/10/21 1000       Date of Initial Exercise RX and Referring Provider   Date 03/10/21    Referring Provider Dietrich Pates MD      Treadmill   MPH 2.2    Grade 1    Minutes 15    METs 2.99      REL-XR   Level 3    Speed 50    Minutes 15    METs 4.6      T5 Nustep   Level 2    SPM 80    Minutes 15    METs 4.6      Track   Laps 28    Minutes 15    METs 2.52      Prescription Details   Frequency (times per week) 2    Duration Progress to 30 minutes of continuous aerobic without signs/symptoms of physical distress      Intensity   THRR 40-80% of Max Heartrate 120-173    Ratings of Perceived Exertion 11-13    Perceived Dyspnea 0-4      Progression   Progression Continue to progress workloads to maintain intensity without  signs/symptoms of physical distress.      Resistance Training   Training Prescription Yes    Weight 5 lb    Reps 10-15             Perform Capillary Blood Glucose checks as needed.  Exercise Prescription Changes:   Exercise Prescription Changes     Row Name 03/10/21 1000             Response to Exercise   Blood Pressure (  Admit) 138/78       Blood Pressure (Exercise) 146/84       Blood Pressure (Exit) 140/80       Heart Rate (Admit) 68 bpm       Heart Rate (Exercise) 97 bpm       Heart Rate (Exit) 70 bpm       Oxygen Saturation (Admit) 97 %       Oxygen Saturation (Exercise) 98 %       Oxygen Saturation (Exit) 98 %       Rating of Perceived Exertion (Exercise) 7       Perceived Dyspnea (Exercise) 0       Symptoms none       Comments walk test results                Exercise Comments:   Exercise Goals and Review:   Exercise Goals     Row Name 03/10/21 1015             Exercise Goals   Increase Physical Activity Yes       Intervention Provide advice, education, support and counseling about physical activity/exercise needs.;Develop an individualized exercise prescription for aerobic and resistive training based on initial evaluation findings, risk stratification, comorbidities and participant's personal goals.       Expected Outcomes Short Term: Attend rehab on a regular basis to increase amount of physical activity.;Long Term: Add in home exercise to make exercise part of routine and to increase amount of physical activity.;Long Term: Exercising regularly at least 3-5 days a week.       Increase Strength and Stamina Yes       Intervention Provide advice, education, support and counseling about physical activity/exercise needs.;Develop an individualized exercise prescription for aerobic and resistive training based on initial evaluation findings, risk stratification, comorbidities and participant's personal goals.       Expected Outcomes Short Term:  Increase workloads from initial exercise prescription for resistance, speed, and METs.;Short Term: Perform resistance training exercises routinely during rehab and add in resistance training at home;Long Term: Improve cardiorespiratory fitness, muscular endurance and strength as measured by increased METs and functional capacity (6MWT)       Able to understand and use rate of perceived exertion (RPE) scale Yes       Intervention Provide education and explanation on how to use RPE scale       Expected Outcomes Short Term: Able to use RPE daily in rehab to express subjective intensity level;Long Term:  Able to use RPE to guide intensity level when exercising independently       Able to understand and use Dyspnea scale Yes       Intervention Provide education and explanation on how to use Dyspnea scale       Expected Outcomes Short Term: Able to use Dyspnea scale daily in rehab to express subjective sense of shortness of breath during exertion;Long Term: Able to use Dyspnea scale to guide intensity level when exercising independently       Knowledge and understanding of Target Heart Rate Range (THRR) Yes       Intervention Provide education and explanation of THRR including how the numbers were predicted and where they are located for reference       Expected Outcomes Short Term: Able to state/look up THRR;Long Term: Able to use THRR to govern intensity when exercising independently;Short Term: Able to use daily as guideline for intensity in rehab  Able to check pulse independently Yes       Intervention Provide education and demonstration on how to check pulse in carotid and radial arteries.;Review the importance of being able to check your own pulse for safety during independent exercise       Expected Outcomes Short Term: Able to explain why pulse checking is important during independent exercise;Long Term: Able to check pulse independently and accurately       Understanding of Exercise  Prescription Yes       Intervention Provide education, explanation, and written materials on patient's individual exercise prescription       Expected Outcomes Short Term: Able to explain program exercise prescription;Long Term: Able to explain home exercise prescription to exercise independently                Exercise Goals Re-Evaluation :   Discharge Exercise Prescription (Final Exercise Prescription Changes):  Exercise Prescription Changes - 03/10/21 1000       Response to Exercise   Blood Pressure (Admit) 138/78    Blood Pressure (Exercise) 146/84    Blood Pressure (Exit) 140/80    Heart Rate (Admit) 68 bpm    Heart Rate (Exercise) 97 bpm    Heart Rate (Exit) 70 bpm    Oxygen Saturation (Admit) 97 %    Oxygen Saturation (Exercise) 98 %    Oxygen Saturation (Exit) 98 %    Rating of Perceived Exertion (Exercise) 7    Perceived Dyspnea (Exercise) 0    Symptoms none    Comments walk test results             Nutrition:  Target Goals: Understanding of nutrition guidelines, daily intake of sodium 1500mg , cholesterol 200mg , calories 30% from fat and 7% or less from saturated fats, daily to have 5 or more servings of fruits and vegetables.  Education: All About Nutrition: -Group instruction provided by verbal, written material, interactive activities, discussions, models, and posters to present general guidelines for heart healthy nutrition including fat, fiber, MyPlate, the role of sodium in heart healthy nutrition, utilization of the nutrition label, and utilization of this knowledge for meal planning. Follow up email sent as well. Written material given at graduation. Flowsheet Row Cardiac Rehab from 03/10/2021 in [facility] Cardiac and Pulmonary Rehab  Education need identified 03/10/21       Biometrics:  Pre Biometrics - 03/10/21 0953       Pre Biometrics   Height 5\' 11"  (1.803 m)    Weight 272 lb 12.8 oz (123.7 kg)    BMI (Calculated) 38.06               Nutrition Therapy Plan and Nutrition Goals:  Nutrition Therapy & Goals - 03/10/21 1025       Intervention Plan   Intervention Prescribe, educate and counsel regarding individualized specific dietary modifications aiming towards targeted core components such as weight, hypertension, lipid management, diabetes, heart failure and other comorbidities.    Expected Outcomes Short Term Goal: Understand basic principles of dietary content, such as calories, fat, sodium, cholesterol and nutrients.;Short Term Goal: A plan has been developed with personal nutrition goals set during dietitian appointment.;Long Term Goal: Adherence to prescribed nutrition plan.             Nutrition Assessments:  MEDIFICTS Score Key: ?70 Need to make dietary changes  40-70 Heart Healthy Diet ? 40 Therapeutic Level Cholesterol Diet  Flowsheet Row Cardiac Rehab from 03/10/2021 in [facility] Cardiac and Pulmonary Rehab  Picture Your Plate  Total Score on Admission 59      Picture Your Plate Scores: <26 Unhealthy dietary pattern with much room for improvement. 41-50 Dietary pattern unlikely to meet recommendations for good health and room for improvement. 51-60 More healthful dietary pattern, with some room for improvement.  >60 Healthy dietary pattern, although there may be some specific behaviors that could be improved.    Nutrition Goals Re-Evaluation:   Nutrition Goals Discharge (Final Nutrition Goals Re-Evaluation):   Psychosocial: Target Goals: Acknowledge presence or absence of significant depression and/or stress, maximize coping skills, provide positive support system. Participant is able to verbalize types and ability to use techniques and skills needed for reducing stress and depression.   Education: Stress, Anxiety, and Depression - Group verbal and visual presentation to define topics covered.  Reviews how body is impacted by stress, anxiety, and depression.  Also discusses healthy ways to  reduce stress and to treat/manage anxiety and depression.  Written material given at graduation.   Education: Sleep Hygiene -Provides group verbal and written instruction about how sleep can affect your health.  Define sleep hygiene, discuss sleep cycles and impact of sleep habits. Review good sleep hygiene tips.    Initial Review & Psychosocial Screening:  Initial Psych Review & Screening - 02/18/21 1305       Initial Review   Current issues with None Identified      Family Dynamics   Good Support System? Yes   parents, girlfriend     Barriers   Psychosocial barriers to participate in program There are no identifiable barriers or psychosocial needs.;The patient should benefit from training in stress management and relaxation.      Screening Interventions   Interventions Encouraged to exercise;Provide feedback about the scores to participant;To provide support and resources with identified psychosocial needs    Expected Outcomes Short Term goal: Utilizing psychosocial counselor, staff and physician to assist with identification of specific Stressors or current issues interfering with healing process. Setting desired goal for each stressor or current issue identified.;Short Term goal: Identification and review with participant of any Quality of Life or Depression concerns found by scoring the questionnaire.;Long Term Goal: Stressors or current issues are controlled or eliminated.;Long Term goal: The participant improves quality of Life and PHQ9 Scores as seen by post scores and/or verbalization of changes             Quality of Life Scores:   Quality of Life - 03/10/21 1017       Quality of Life   Select Quality of Life      Quality of Life Scores   Health/Function Pre 27.83 %    Socioeconomic Pre 27.75 %    Psych/Spiritual Pre 27.93 %    Family Pre 27.6 %    GLOBAL Pre 27.8 %            Scores of 19 and below usually indicate a poorer quality of life in these areas.   A difference of  2-3 points is a clinically meaningful difference.  A difference of 2-3 points in the total score of the Quality of Life Index has been associated with significant improvement in overall quality of life, self-image, physical symptoms, and general health in studies assessing change in quality of life.  PHQ-9: Recent Review Flowsheet Data     Depression screen [facility] 2/9 03/10/2021   Decreased Interest 0   Down, Depressed, Hopeless 0   PHQ - 2 Score 0   Altered sleeping 1   Tired,  decreased energy 0   Change in appetite 1   Feeling bad or failure about yourself  1   Trouble concentrating 0   Moving slowly or fidgety/restless 0   Suicidal thoughts 0   PHQ-9 Score 3   Difficult doing work/chores Not difficult at all      Interpretation of Total Score  Total Score Depression Severity:  1-4 = Minimal depression, 5-9 = Mild depression, 10-14 = Moderate depression, 15-19 = Moderately severe depression, 20-27 = Severe depression   Psychosocial Evaluation and Intervention:  Psychosocial Evaluation - 02/18/21 1310       Psychosocial Evaluation & Interventions   Interventions Encouraged to exercise with the program and follow exercise prescription    Comments Horace is coming to cardiac rehab after having a NSTEMI. It did come as a suprise to him as he is young, but states he is handling it well. He is motivated to take care of his body and manage his health. His parents and girlfriend are his support system. He had just gotten a job at [employer] when his MI happened, so he is hoping to find another job soon.    Expected Outcomes Short: attend cardiac rehab for education and exercise. Long: develop positive self care habits.    Continue Psychosocial Services  Follow up required by staff             Psychosocial Re-Evaluation:   Psychosocial Discharge (Final Psychosocial Re-Evaluation):   Vocational Rehabilitation: Provide vocational rehab assistance to qualifying  candidates.   Vocational Rehab Evaluation & Intervention:   Education: Education Goals: Education classes will be provided on a variety of topics geared toward better understanding of heart health and risk factor modification. Participant will state understanding/return demonstration of topics presented as noted by education test scores.  Learning Barriers/Preferences:  Learning Barriers/Preferences - 02/18/21 1305       Learning Barriers/Preferences   Learning Barriers None    Learning Preferences None             General Cardiac Education Topics:  AED/CPR: - Group verbal and written instruction with the use of models to demonstrate the basic use of the AED with the basic ABC's of resuscitation.   Anatomy and Cardiac Procedures: - Group verbal and visual presentation and models provide information about basic cardiac anatomy and function. Reviews the testing methods done to diagnose heart disease and the outcomes of the test results. Describes the treatment choices: Medical Management, Angioplasty, or Coronary Bypass Surgery for treating various heart conditions including Myocardial Infarction, Angina, Valve Disease, and Cardiac Arrhythmias.  Written material given at graduation.   Medication Safety: - Group verbal and visual instruction to review commonly prescribed medications for heart and lung disease. Reviews the medication, class of the drug, and side effects. Includes the steps to properly store meds and maintain the prescription regimen.  Written material given at graduation.   Intimacy: - Group verbal instruction through game format to discuss how heart and lung disease can affect sexual intimacy. Written material given at graduation..   Know Your Numbers and Heart Failure: - Group verbal and visual instruction to discuss disease risk factors for cardiac and pulmonary disease and treatment options.  Reviews associated critical values for Overweight/Obesity,  Hypertension, Cholesterol, and Diabetes.  Discusses basics of heart failure: signs/symptoms and treatments.  Introduces Heart Failure Zone chart for action plan for heart failure.  Written material given at graduation. Flowsheet Row Cardiac Rehab from 03/10/2021 in [facility] Cardiac and Pulmonary Rehab  Education need identified 03/10/21       Infection Prevention: - Provides verbal and written material to individual with discussion of infection control including proper hand washing and proper equipment cleaning during exercise session. Flowsheet Row Cardiac Rehab from 03/10/2021 in [facility] Cardiac and Pulmonary Rehab  Education need identified 03/10/21  Date 03/10/21  Educator KL  Instruction Review Code 1- Verbalizes Understanding       Falls Prevention: - Provides verbal and written material to individual with discussion of falls prevention and safety. Flowsheet Row Cardiac Rehab from 03/10/2021 in [facility] Cardiac and Pulmonary Rehab  Education need identified 03/10/21  Date 03/10/21  Educator KL  Instruction Review Code 1- Verbalizes Understanding       Other: -Provides group and verbal instruction on various topics (see comments)   Knowledge Questionnaire Score:  Knowledge Questionnaire Score - 03/10/21 0926       Knowledge Questionnaire Score   Pre Score 18/26: Nutrition, Exercise, MI. HF             Core Components/Risk Factors/Patient Goals at Admission:  Personal Goals and Risk Factors at Admission - 03/10/21 1016       Core Components/Risk Factors/Patient Goals on Admission    Weight Management Yes;Weight Loss    Intervention Weight Management: Develop a combined nutrition and exercise program designed to reach desired caloric intake, while maintaining appropriate intake of nutrient and fiber, sodium and fats, and appropriate energy expenditure required for the weight goal.;Weight Management: Provide education and appropriate resources to help participant work on and  attain dietary goals.;Weight Management/Obesity: Establish reasonable short term and long term weight goals.    Admit Weight 272 lb (123.4 kg)    Goal Weight: Short Term 265 lb (120.2 kg)    Goal Weight: Long Term 200 lb (90.7 kg)    Expected Outcomes Short Term: Continue to assess and modify interventions until short term weight is achieved;Long Term: Adherence to nutrition and physical activity/exercise program aimed toward attainment of established weight goal;Weight Loss: Understanding of general recommendations for a balanced deficit meal plan, which promotes 1-2 lb weight loss per week and includes a negative energy balance of [phone] kcal/d;Understanding recommendations for meals to include 15-35% energy as protein, 25-35% energy from fat, 35-60% energy from carbohydrates, less than 200mg  of dietary cholesterol, 20-35 gm of total fiber daily;Understanding of distribution of calorie intake throughout the day with the consumption of 4-5 meals/snacks    Hypertension Yes    Intervention Provide education on lifestyle modifcations including regular physical activity/exercise, weight management, moderate sodium restriction and increased consumption of fresh fruit, vegetables, and low fat dairy, alcohol moderation, and smoking cessation.;Monitor prescription use compliance.    Expected Outcomes Short Term: Continued assessment and intervention until BP is < 140/68mm HG in hypertensive participants. < 130/48mm HG in hypertensive participants with diabetes, heart failure or chronic kidney disease.;Long Term: Maintenance of blood pressure at goal levels.             Education:Diabetes - Individual verbal and written instruction to review signs/symptoms of diabetes, desired ranges of glucose level fasting, after meals and with exercise. Acknowledge that pre and post exercise glucose checks will be done for 3 sessions at entry of program.   Core Components/Risk Factors/Patient Goals Review:    Core  Components/Risk Factors/Patient Goals at Discharge (Final Review):    ITP Comments:  ITP Comments     Row Name 02/18/21 1309 03/10/21 0921 03/23/21 0736 03/24/21 1004 03/31/21 1358   ITP Comments Initial  telephone orientation completed. Diagnosis can be found in [facility] 7/22. EP orientation scheduled for Thursday 8/11 at 10am. Completed and gym orientation. Initial ITP created and sent for review to Dr. Bethann Punches, Medical Director. 30 Day review completed. Medical Director ITP review done, changes made as directed, and signed approval by Medical Director. Attempted to call patient as he has not showed up for any initial exercise sessions for Cardiac Rehab. Called patient and left message asking for call back. Called patient again regarding Cardiac Rehab attendance. He took a Covid test and it was negative. He is feeling much better, denies other symptoms and plans to start rehab next Tuesday, 9/20.    Row Name 04/14/21 1419           ITP Comments No response or callback from patient, will discharge at this time.                Comments: Discharge ITP

## 2021-04-15 NOTE — Telephone Encounter (Signed)
Left message for patient to call back.  I still do not see that patient has scheduled the cardiac MRI or follow up.

## 2021-05-07 NOTE — Progress Notes (Signed)
Please set pt up to see me in [place] He did not have MRI done.   Please see if there was difficulty scheduling

## 2021-05-09 ENCOUNTER — Telehealth: Payer: Self-pay

## 2021-05-09 NOTE — Telephone Encounter (Signed)
Per Dr. Tenny Craw: Please set pt up to see me in [place] He did not have MRI done.   Please see if there was difficulty scheduling   Left a message for the pt to call back.    From 02/21/21 note:  He is pain free but would set up for MRI to evaluate for viability at apex and also possible sites of inflammation elsewhere  3  Plan for repeat echo later this fall

## 2021-05-09 NOTE — Telephone Encounter (Signed)
-----   Message from Pricilla Riffle, MD sent at 05/07/2021  9:13 PM EDT -----    ----- Message ----- From: Lorene Dy Sent: 04/14/2021   2:23 PM EDT To: Pricilla Riffle, MD  Hi Dr. Tenny Craw,  Yunus was discharged early today from Cardiac Rehab due to lack of attendance. Thank you for this referral,  Missy Sabins, [place] [phone]

## 2021-05-12 NOTE — Telephone Encounter (Signed)
Left a message for the pt to call back.  

## 2021-05-24 NOTE — Telephone Encounter (Signed)
Left message for the pt to call back #2... will send letter when back in the office 05/25/21.

## 2021-05-30 NOTE — Telephone Encounter (Signed)
Letter send to the pt to make a follow up appt... will close this encounter.

## 2023-02-01 ENCOUNTER — Emergency Department: Payer: Commercial Managed Care - PPO

## 2023-02-01 ENCOUNTER — Emergency Department
Admission: EM | Admit: 2023-02-01 | Discharge: 2023-02-01 | Disposition: A | Discharge status: Home / Self Care | Payer: Commercial Managed Care - PPO | Attending: Emergency Medicine | Admitting: Emergency Medicine

## 2023-02-01 ENCOUNTER — Encounter: Payer: Self-pay | Admitting: *Deleted

## 2023-02-01 ENCOUNTER — Other Ambulatory Visit: Payer: Self-pay

## 2023-02-01 DIAGNOSIS — D696 Thrombocytopenia, unspecified: Secondary | ICD-10-CM | POA: Diagnosis not present

## 2023-02-01 DIAGNOSIS — I10 Essential (primary) hypertension: Secondary | ICD-10-CM | POA: Diagnosis not present

## 2023-02-01 DIAGNOSIS — R7401 Elevation of levels of liver transaminase levels: Secondary | ICD-10-CM

## 2023-02-01 DIAGNOSIS — R000 Tachycardia, unspecified: Secondary | ICD-10-CM | POA: Insufficient documentation

## 2023-02-01 DIAGNOSIS — Z1152 Encounter for screening for COVID-19: Secondary | ICD-10-CM | POA: Insufficient documentation

## 2023-02-01 DIAGNOSIS — D649 Anemia, unspecified: Secondary | ICD-10-CM | POA: Diagnosis not present

## 2023-02-01 DIAGNOSIS — D61818 Other pancytopenia: Secondary | ICD-10-CM | POA: Diagnosis not present

## 2023-02-01 DIAGNOSIS — R079 Chest pain, unspecified: Secondary | ICD-10-CM | POA: Diagnosis present

## 2023-02-01 HISTORY — DX: Acute myocardial infarction, unspecified: I21.9

## 2023-02-01 LAB — COMPREHENSIVE METABOLIC PANEL
ALT: 46 U/L — ABNORMAL HIGH (ref 0–44)
AST: 135 U/L — ABNORMAL HIGH (ref 15–41)
Albumin: 4.9 g/dL (ref 3.5–5.0)
Alkaline Phosphatase: 39 U/L (ref 38–126)
Anion gap: 11 (ref 5–15)
BUN: 10 mg/dL (ref 6–20)
CO2: 22 mmol/L (ref 22–32)
Calcium: 9.4 mg/dL (ref 8.9–10.3)
Chloride: 103 mmol/L (ref 98–111)
Creatinine, Ser: 0.82 mg/dL (ref 0.61–1.24)
GFR, Estimated: >60 mL/min (ref >60)
Glucose, Bld: 140 mg/dL — ABNORMAL HIGH (ref 70–99)
Potassium: 3.5 mmol/L (ref 3.5–5.1)
Sodium: 136 mmol/L (ref 135–145)
Total Bilirubin: 3.3 mg/dL — ABNORMAL HIGH (ref 0.3–1.2)
Total Protein: 7.6 g/dL (ref 6.5–8.1)

## 2023-02-01 LAB — RETICULOCYTES
Immature Retic Fract: 31.1 % — ABNORMAL HIGH (ref 2.3–15.9)
RBC.: 1.44 MIL/uL — ABNORMAL LOW (ref 4.22–5.81)
Retic Count, Absolute: 22.3 10*3/uL (ref 19.0–186.0)
Retic Ct Pct: 1.6 % (ref 0.4–3.1)

## 2023-02-01 LAB — PREPARE RBC (CROSSMATCH)

## 2023-02-01 LAB — IRON AND TIBC
Iron: 114 ug/dL (ref 45–182)
Saturation Ratios: 46 % — ABNORMAL HIGH (ref 17.9–39.5)
TIBC: 251 ug/dL (ref 250–450)
UIBC: 137 ug/dL

## 2023-02-01 LAB — URINE DRUG SCREEN, QUALITATIVE (ARMC ONLY)
Amphetamines, Ur Screen: NOT DETECTED
Barbiturates, Ur Screen: NOT DETECTED
Benzodiazepine, Ur Scrn: NOT DETECTED
Cannabinoid 50 Ng, Ur ~~LOC~~: POSITIVE — AB
Cocaine Metabolite,Ur ~~LOC~~: NOT DETECTED
MDMA (Ecstasy)Ur Screen: NOT DETECTED
Methadone Scn, Ur: NOT DETECTED
Opiate, Ur Screen: NOT DETECTED
Phencyclidine (PCP) Ur S: NOT DETECTED
Tricyclic, Ur Screen: NOT DETECTED

## 2023-02-01 LAB — SARS CORONAVIRUS 2 BY RT PCR: SARS Coronavirus 2 by RT PCR: NEGATIVE

## 2023-02-01 LAB — CBC WITH DIFFERENTIAL/PLATELET
Abs Immature Granulocytes: 0.15 10*3/uL — ABNORMAL HIGH (ref 0.00–0.07)
Basophils Absolute: 0 10*3/uL (ref 0.0–0.1)
Basophils Relative: 1 %
Eosinophils Absolute: 0.1 10*3/uL (ref 0.0–0.5)
Eosinophils Relative: 1 %
HCT: 15.4 % — ABNORMAL LOW (ref 39.0–52.0)
Hemoglobin: 4.9 g/dL — CL (ref 13.0–17.0)
Immature Granulocytes: 4 %
Lymphocytes Relative: 57 %
Lymphs Abs: 2.5 10*3/uL (ref 0.7–4.0)
MCH: 30.1 pg (ref 26.0–34.0)
MCHC: 31.8 g/dL (ref 30.0–36.0)
MCV: 94.5 fL (ref 80.0–100.0)
Monocytes Absolute: 0.2 10*3/uL (ref 0.1–1.0)
Monocytes Relative: 4 %
Neutro Abs: 1.4 10*3/uL — ABNORMAL LOW (ref 1.7–7.7)
Neutrophils Relative %: 33 %
Platelets: 60 10*3/uL — ABNORMAL LOW (ref 150–400)
RBC: 1.63 MIL/uL — ABNORMAL LOW (ref 4.22–5.81)
RDW: 29.6 % — ABNORMAL HIGH (ref 11.5–15.5)
Smear Review: NORMAL
WBC: 4.2 10*3/uL (ref 4.0–10.5)
nRBC: 3.1 % — ABNORMAL HIGH (ref 0.0–0.2)

## 2023-02-01 LAB — HEPATITIS PANEL, ACUTE
HCV Ab: NONREACTIVE
Hep A IgM: NONREACTIVE
Hep B C IgM: NONREACTIVE
Hepatitis B Surface Ag: NONREACTIVE

## 2023-02-01 LAB — TECHNOLOGIST SMEAR REVIEW: Plt Morphology: DECREASED

## 2023-02-01 LAB — APTT: aPTT: 27 seconds (ref 24–36)

## 2023-02-01 LAB — LACTATE DEHYDROGENASE: LDH: 5916 U/L — ABNORMAL HIGH (ref 98–192)

## 2023-02-01 LAB — ABO/RH: ABO/RH(D): O POS

## 2023-02-01 LAB — BILIRUBIN, FRACTIONATED(TOT/DIR/INDIR)
Bilirubin, Direct: 0.5 mg/dL — ABNORMAL HIGH (ref 0.0–0.2)
Indirect Bilirubin: 2.5 mg/dL — ABNORMAL HIGH (ref 0.3–0.9)
Total Bilirubin: 3 mg/dL — ABNORMAL HIGH (ref 0.3–1.2)

## 2023-02-01 LAB — FOLATE: Folate: 18.3 ng/mL (ref >5.9)

## 2023-02-01 LAB — VITAMIN B12: Vitamin B-12: 66 pg/mL — ABNORMAL LOW (ref 180–914)

## 2023-02-01 LAB — T4, FREE: Free T4: 0.82 ng/dL (ref 0.61–1.12)

## 2023-02-01 LAB — PROTIME-INR
INR: 1.2 (ref 0.8–1.2)
Prothrombin Time: 15.1 seconds (ref 11.4–15.2)

## 2023-02-01 LAB — MAGNESIUM: Magnesium: 2.2 mg/dL (ref 1.7–2.4)

## 2023-02-01 LAB — LIPASE, BLOOD: Lipase: 42 U/L (ref 11–51)

## 2023-02-01 LAB — PATHOLOGIST SMEAR REVIEW

## 2023-02-01 LAB — TROPONIN I (HIGH SENSITIVITY): Troponin I (High Sensitivity): 12 ng/L (ref <18)

## 2023-02-01 LAB — TSH: TSH: 1.966 u[IU]/mL (ref 0.350–4.500)

## 2023-02-01 LAB — FERRITIN: Ferritin: 615 ng/mL — ABNORMAL HIGH (ref 24–336)

## 2023-02-01 MED ORDER — IOHEXOL 300 MG/ML  SOLN
100.0000 mL | Freq: Once | INTRAMUSCULAR | Status: AC | PRN
  Administered 2023-02-01: 100 mL via INTRAVENOUS

## 2023-02-01 MED ORDER — ACETAMINOPHEN 500 MG PO TABS
1000.0000 mg | ORAL_TABLET | Freq: Once | ORAL | Status: AC
  Administered 2023-02-01: 1000 mg via ORAL
  Filled 2023-02-01: qty 2

## 2023-02-01 MED ORDER — SODIUM CHLORIDE 0.9 % IV SOLN
10.0000 mL/h | Freq: Once | INTRAVENOUS | Status: AC
  Administered 2023-02-01: 10 mL/h via INTRAVENOUS

## 2023-02-01 NOTE — Assessment & Plan Note (Signed)
AST 135, ALT 46 Will check hepatitis panel CT A&P pending

## 2023-02-01 NOTE — Assessment & Plan Note (Signed)
Patient presenting with generalized malaise fatigue for multiple weeks with noted hemoglobin of 4.9 as well as platelet count of 60 White count around 4.2 at present Differential diagnosis is fairly broad with concern for autoimmune versus neoplastic process Peripheral smear, LDH, haptoglobin, anemia panel pending Dr. Cathie Hoops with hematology oncology consulted for formal evaluation Follow-up formal recommendations

## 2023-02-01 NOTE — ED Provider Notes (Signed)
[facility] Provider Note    Event Date/Time   First MD Initiated Contact with Patient 02/01/23 [phone]     (approximate)   History   Chief Complaint: Chest Pain   HPI  Peter Monroe is a 22 y.o. male with a past history of hypertension who comes ED complaining of intermittent chest pain and shortness of breath with exertion along with fatigue for the last few weeks.  Also endorses 20 pound weight loss over the past few months.  Denies fever or chills or bodyaches.     Physical Exam   Triage Vital Signs: ED Triage Vitals  Encounter Vitals Group     BP 02/01/23 0915 (!) 161/95     Systolic BP Percentile --      Diastolic BP Percentile --      Pulse Rate 02/01/23 0915 (!) 123     Resp 02/01/23 0915 20     Temp 02/01/23 0915 98.5 F (36.9 C)     Temp Source 02/01/23 0915 Oral     SpO2 02/01/23 0915 100 %     Weight 02/01/23 0916 235 lb (106.6 kg)     Height 02/01/23 0916 6' (1.829 m)     Head Circumference --      Peak Flow --      Pain Score 02/01/23 0916 0     Pain Loc --      Pain Education --      Exclude from Growth Chart --     Most recent vital signs: Vitals:   02/01/23 1251 02/01/23 1326  BP: 131/76 129/74  Pulse: 98 96  Resp: (!) 22 17  Temp: 99.7 F (37.6 C) (!) 100.6 F (38.1 C)  SpO2: 100% 100%    General: Awake, no distress.  CV:  Good peripheral perfusion.  Tachycardia heart rate 110 Resp:  Normal effort.  Clear to auscultation bilaterally Abd:  No distention.  Soft nontender Other:  Moist oral mucosa   ED Results / Procedures / Treatments   Labs (all labs ordered are listed, but only abnormal results are displayed) Labs Reviewed  COMPREHENSIVE METABOLIC PANEL - Abnormal; Notable for the following components:      Result Value   Glucose, Bld 140 (*)    AST 135 (*)    ALT 46 (*)    Total Bilirubin 3.3 (*)    All other components within normal limits  CBC WITH DIFFERENTIAL/PLATELET - Abnormal; Notable for the  following components:   RBC 1.63 (*)    Hemoglobin 4.9 (*)    HCT 15.4 (*)    RDW 29.6 (*)    Platelets 60 (*)    nRBC 3.1 (*)    Neutro Abs 1.4 (*)    Abs Immature Granulocytes 0.15 (*)    All other components within normal limits  IRON AND TIBC - Abnormal; Notable for the following components:   Saturation Ratios 46 (*)    All other components within normal limits  FERRITIN - Abnormal; Notable for the following components:   Ferritin 615 (*)    All other components within normal limits  RETICULOCYTES - Abnormal; Notable for the following components:   RBC. 1.44 (*)    Immature Retic Fract 31.1 (*)    All other components within normal limits  LACTATE DEHYDROGENASE - Abnormal; Notable for the following components:   LDH 5,916 (*)    All other components within normal limits  BILIRUBIN, FRACTIONATED(TOT/DIR/INDIR) - Abnormal; Notable for the following  components:   Total Bilirubin 3.0 (*)    Bilirubin, Direct 0.5 (*)    Indirect Bilirubin 2.5 (*)    All other components within normal limits  URINE DRUG SCREEN, QUALITATIVE ([facility] ONLY) - Abnormal; Notable for the following components:   Cannabinoid 50 Ng, Ur [place] POSITIVE (*)    All other components within normal limits  SARS CORONAVIRUS 2 BY RT PCR  LIPASE, BLOOD  TSH  T4, FREE  MAGNESIUM  FOLATE  PROTIME-INR  APTT  TECHNOLOGIST SMEAR REVIEW  PATHOLOGIST SMEAR REVIEW  VITAMIN B12  HAPTOGLOBIN  HEPATITIS PANEL, ACUTE  ADAMTS13 ACTIVITY  PREPARE RBC (CROSSMATCH)  ABO/RH  TYPE AND SCREEN  TROPONIN I (HIGH SENSITIVITY)     EKG Interpreted by me Sinus tachycardia rate 115.  Normal axis, normal intervals.  Normal QRS ST segments and T waves.   RADIOLOGY Chest x-ray interpreted by me, appears unremarkable.  Radiology report reviewed   PROCEDURES:  .Critical Care  Performed by: Sharman Cheek, MD Authorized by: Sharman Cheek, MD   Critical care provider statement:    Critical care time (minutes):  35    Critical care time was exclusive of:  Separately billable procedures and treating other patients   Critical care was necessary to treat or prevent imminent or life-threatening deterioration of the following conditions:  Circulatory failure   Critical care was time spent personally by me on the following activities:  Development of treatment plan with patient or surrogate, discussions with consultants, evaluation of patient's response to treatment, examination of patient, obtaining history from patient or surrogate, ordering and performing treatments and interventions, ordering and review of laboratory studies, ordering and review of radiographic studies, pulse oximetry, re-evaluation of patient's condition and review of old charts   Care discussed with: accepting provider at another facility      MEDICATIONS ORDERED IN ED: Medications  0.9 %  sodium chloride infusion (10 mL/hr Intravenous New Bag/Given 02/01/23 1321)  iohexol (OMNIPAQUE) 300 MG/ML solution 100 mL (100 mLs Intravenous Contrast Given 02/01/23 1138)  acetaminophen (TYLENOL) tablet 1,000 mg (1,000 mg Oral Given 02/01/23 1335)     IMPRESSION / MDM / ASSESSMENT AND PLAN / ED COURSE  I reviewed the triage vital signs and the nursing notes.  DDx: Pneumonia, pneumothorax, hyperthyroidism, AKI, electrolyte abnormality, anemia, dehydration  Patient's presentation is most consistent with acute presentation with potential threat to life or bodily function.  Patient presents with weight loss and fatigue over the past few weeks or more.  In the ED, vital signs unremarkable except for tachycardia.  Initial lab panel reveals hemoglobin of 4.9, platelets of 60.  No symptoms of acute blood loss.  Discussed with patient who agrees with blood transfusion.  Discussed with hospitalist who recommends abdominal imaging, additional labs to ensure patient is appropriate for care at [facility].   Clinical Course as of 02/01/23 1412  Thu Feb 01, 2023  1319 D/w  pathology and hematology - intermediate risk for TTP, transfer recommended.  [PS]  1410 Pt accepted for transfer to [facility] by Dr. Kem Kays. [PS]    Clinical Course User Index [PS] Sharman Cheek, MD     FINAL CLINICAL IMPRESSION(S) / ED DIAGNOSES   Final diagnoses:  Symptomatic anemia  Thrombocytopenia (HCC)     Rx / DC Orders   ED Discharge Orders     None        Note:  This document was prepared using Dragon voice recognition software and may include unintentional dictation errors.  Sharman Cheek, MD 02/01/23 [phone]

## 2023-02-01 NOTE — ED Notes (Signed)
Pt asking to eat and if partner can drive him to [facility] rather than using transport. EDP notified and will talk to pt. Pt can eat.

## 2023-02-01 NOTE — Consult Note (Signed)
Initial Consultation Note   Patient: Peter Monroe KGM:[id number] DOB: January 08, 2001 PCP: Oswaldo Conroy, MD DOA: 02/01/2023 DOS: the patient was seen and examined on 02/01/2023 Primary service: Sharman Cheek, MD  Referring physician: Sharman Cheek MD  Reason for consult: Anemia, thrombocytopenia   Assessment/Plan: Assessment and Plan: Transaminitis AST 135, ALT 46 Will check hepatitis panel CT A&P pending     Hyperbilirubinemia T. bili 3.3 with noted scleral icterus in the setting of pancytopenia CT abdomen pelvis pending to further evaluate Fractionated bilirubin level also pending   Pancytopenia [facility]) Patient presenting with generalized malaise fatigue for multiple weeks with noted hemoglobin of 4.9 as well as platelet count of 60 White count around 4.2 at present Differential diagnosis is fairly broad with concern for autoimmune versus neoplastic process Peripheral smear, LDH, haptoglobin, anemia panel pending Dr. Cathie Hoops with hematology oncology consulted for formal evaluation Follow-up formal recommendations        TRH will sign off at present, please call us again when needed.  HPI: Peter Monroe is a 22 y.o. male with past medical history of NSTEMI, obesity presenting with anemia, thrombocytopenia, hyperbilirubinemia, transaminitis.  Patient reports approximately 1 to 2 weeks of worsening malaise and fatigue.  No fevers or chills.  Has had some unintentional weight loss over the past 1 to 2 months associated with working as a Scientist, forensic.  No chest pain.  No reported easy bruising.  Denies any illicit drug use apart from marijuana use.  Has had some nausea and vomiting associated with work exertion.  Emesis nonbloody nonbilious.  Denies any black or bloody stools.  Noted admission July 2022 for NSTEMI.  Patient not taking aspirin. Presented to the ER afebrile, heart rate 100s, BP stable.  White count 4.2, hemoglobin 4.9, platelets 60, creatinine 0.2, AST 135, ALT  46, T. bili 3.3.  Trop neg x 1. EKG NSR. CT abdomen pelvis grossly stable apart from possible chronic colitis as well as enlarged fatty liver and enlarged spleen.  Peripheral smear showing preliminary schistocytes.  Dr. Cathie Hoops with hematology oncology evaluate the patient with recommendation to transfer with concern for possible TTP and the need for exchange transfusion. Review of Systems: As mentioned in the history of present illness. All other systems reviewed and are negative. Past Medical History:  Diagnosis Date   MI (myocardial infarction) [facility])    Past Surgical History:  Procedure Laterality Date   LEFT HEART CATH AND CORONARY ANGIOGRAPHY N/A 02/07/2021   Procedure: LEFT HEART CATH AND CORONARY ANGIOGRAPHY;  Surgeon: Yvonne Kendall, MD;  Location: [facility] INVASIVE CV LAB;  Service: Cardiovascular;  Laterality: N/A;   Social History:  reports that he has never smoked. He has never used smokeless tobacco. He reports current drug use. Drug: Marijuana. He reports that he does not drink alcohol.  No Known Allergies  History reviewed. No pertinent family history.  Prior to Admission medications   Medication Sig Start Date End Date Taking? Authorizing Provider  aspirin EC 81 MG EC tablet Take 1 tablet (81 mg total) by mouth daily. Swallow whole. 02/09/21   Alford Highland, MD  atorvastatin (LIPITOR) 80 MG tablet Take 1 tablet (80 mg total) by mouth daily. 03/30/21   Pricilla Riffle, MD  clopidogrel (PLAVIX) 75 MG tablet Take 1 tablet (75 mg total) by mouth daily with breakfast. 03/30/21   Pricilla Riffle, MD  losartan (COZAAR) 25 MG tablet Take 1 tablet (25 mg total) by mouth daily. 02/28/21   Pricilla Riffle, MD  metoprolol  succinate (TOPROL-XL) 25 MG 24 hr tablet Take 1 tablet (25 mg total) by mouth daily. 03/30/21   Pricilla Riffle, MD  vitamin B-12 1000 MCG tablet Take 1 tablet (1,000 mcg total) by mouth daily. 02/08/21   Alford Highland, MD    Physical Exam: Vitals:   02/01/23 0915 02/01/23 0916  02/01/23 1130  BP: (!) 161/95  129/67  Pulse: (!) 123  (!) 105  Resp: 20  20  Temp: 98.5 F (36.9 C)    TempSrc: Oral    SpO2: 100%  100%  Weight:  106.6 kg   Height:  6' (1.829 m)    Physical Exam Constitutional:      Appearance: He is obese.  HENT:     Head: Normocephalic and atraumatic.     Nose: Nose normal.     Mouth/Throat:     Mouth: Mucous membranes are moist.  Eyes:     General: Scleral icterus present.     Pupils: Pupils are equal, round, and reactive to light.  Cardiovascular:     Rate and Rhythm: Normal rate and regular rhythm.  Pulmonary:     Effort: Pulmonary effort is normal.  Abdominal:     General: Bowel sounds are normal.  Musculoskeletal:        General: Normal range of motion.  Skin:    General: Skin is warm.  Neurological:     General: No focal deficit present.  Psychiatric:        Mood and Affect: Mood normal.     Data Reviewed:   There are no new results to review at this time.  CT ABDOMEN PELVIS W CONTRAST CLINICAL DATA:  Splenomegaly, anemia  EXAM: CT ABDOMEN AND PELVIS WITH CONTRAST  TECHNIQUE: Multidetector CT imaging of the abdomen and pelvis was performed using the standard protocol following bolus administration of intravenous contrast.  RADIATION DOSE REDUCTION: This exam was performed according to the departmental dose-optimization program which includes automated exposure control, adjustment of the mA and/or kV according to patient size and/or use of iterative reconstruction technique.  CONTRAST:  OMNIPAQUE IOHEXOL 300 MG/ML  SOLN  COMPARISON:  None Available.  FINDINGS: Lower chest: Visualized lower lung fields are clear.  Hepatobiliary: Liver is enlarged measuring 22.4 cm. There is fatty infiltration. There is no dilation of bile ducts. Gallbladder is unremarkable. There is subtle low-attenuation in liver close to gallbladder fossa, possibly part of fatty infiltration or cyst or hemangioma.  Pancreas: No  focal abnormalities are seen.  Spleen: Spleen measures 15.5 cm in maximum diameter.  Adrenals/Urinary Tract: Adrenals are unremarkable. There is no hydronephrosis. There are no renal or ureteral stones. Urinary bladder is not distended. There is mild diffuse wall thickening in the bladder.  Stomach/Bowel: Stomach is unremarkable. Small bowel loops are not dilated. Appendix is not dilated. There is decreased density in submucosal region in ascending and transverse colon. There is no focal wall thickening. There is no pericolic stranding.  Vascular/Lymphatic: Unremarkable.  Reproductive: Unremarkable.  Other: There is no ascites or pneumoperitoneum. Small umbilical hernia containing fat is seen.  Musculoskeletal: There is minimal retrolisthesis at L5-S1 level.  IMPRESSION: There is no evidence of intestinal obstruction or pneumoperitoneum. There is no hydronephrosis. Appendix is not dilated.  Enlarged fatty liver.  Enlarged spleen.  There is low-density in submucosal location in the ascending and transverse colon suggesting possible nonspecific chronic colitis.  There is mild diffuse wall thickening in urinary bladder which may be due to incomplete distention or suggest cystitis.  Electronically Signed   By: Ernie Avena M.D.   On: 02/01/2023 12:12 DG Chest 2 View CLINICAL DATA:  cp/ SOB  EXAM: CHEST - 2 VIEW  COMPARISON:  CXR 02/04/21  FINDINGS: No pleural effusion. No pneumothorax. Normal cardiac and mediastinal contours. No focal airspace opacity. No radiographically apparent displaced rib fractures. Visualized upper abdomen is unremarkable. Vertebral body heights are maintained.  IMPRESSION: No focal airspace opacity  Electronically Signed   By: Lorenza Cambridge M.D.   On: 02/01/2023 10:02  Lab Results  Component Value Date   WBC 4.2 02/01/2023   HGB 4.9 (LL) 02/01/2023   HCT 15.4 (L) 02/01/2023   MCV 94.5 02/01/2023   PLT 60 (L) 02/01/2023    Last metabolic panel Lab Results  Component Value Date   GLUCOSE 140 (H) 02/01/2023   NA 136 02/01/2023   K 3.5 02/01/2023   CL 103 02/01/2023   CO2 22 02/01/2023   BUN 10 02/01/2023   CREATININE 0.82 02/01/2023   GFRNONAA >60 02/01/2023   CALCIUM 9.4 02/01/2023   PROT 7.6 02/01/2023   ALBUMIN 4.9 02/01/2023   BILITOT 3.0 (H) 02/01/2023   ALKPHOS 39 02/01/2023   AST 135 (H) 02/01/2023   ALT 46 (H) 02/01/2023   ANIONGAP 11 02/01/2023      Family Communication: Patient made aware of broad workup  Primary team communication: Dr. Scotty Court made aware of need for transfer  Thank you very much for involving [place] in the care of your patient.  Author: Floydene Flock, MD 02/01/2023 12:52 PM  For on call review [email or web address].

## 2023-02-01 NOTE — ED Notes (Signed)
Critical Lab of 4.9 hgb shared with Scotty Court MD.

## 2023-02-01 NOTE — Consult Note (Addendum)
Hematology/Oncology Consult note [phone] [phone]      Patient Care Team: Oswaldo Conroy, MD as PCP - General (Family Medicine)   Name of the patient: Peter Monroe  [id number]  07-23-2000   REASON FOR COSULTATION:   History of presenting illness-  23 y.o. male with PMH listed at below who presents to ER complaining fatigue, weakness x 1 week  shortness of breath and mild chest pain.  In ER BP was 161/95, HR 105, SpO2 100 Labs showed Hb 4.9, MCV 94.5, platelet 60,000 his counts are significantly lower than his hb of 13, platelet count of 545,000 in 2022.   He denies fever, chills, Denies weight loss, fever, chills, fatigue, night sweats. No nausea vomiting diarrhea.  No bleeding events.  History of MI.  He does not smoke or drink alcohol. Occasionally use of marijuana, no other recreational drugs.   No Known Allergies  Patient Active Problem List   Diagnosis Date Noted   Pancytopenia (HCC) 02/01/2023   Metabolic syndrome    Hypokalemia    Elevated troponin    Anemia due to vitamin B12 deficiency    NSTEMI (non-ST elevated myocardial infarction) (HCC)    Impaired fasting glucose    Macrocytic anemia    Hyperlipidemia    Obesity (BMI 35.0-39.9 without comorbidity)    Essential hypertension    Chest pain 02/04/2021     Past Medical History:  Diagnosis Date   MI (myocardial infarction) [facility])      Past Surgical History:  Procedure Laterality Date   LEFT HEART CATH AND CORONARY ANGIOGRAPHY N/A 02/07/2021   Procedure: LEFT HEART CATH AND CORONARY ANGIOGRAPHY;  Surgeon: Yvonne Kendall, MD;  Location: [facility] INVASIVE CV LAB;  Service: Cardiovascular;  Laterality: N/A;    Social History   Socioeconomic History   Marital status: Single    Spouse name: Not on file   Number of children: Not on file   Years of education: Not on file   Highest education level: Not on file  Occupational History   Not on file  Tobacco Use   Smoking  status: Never   Smokeless tobacco: Never  Substance and Sexual Activity   Alcohol use: No    Alcohol/week: 0.0 standard drinks of alcohol   Drug use: Yes    Types: Marijuana   Sexual activity: Not on file  Other Topics Concern   Not on file  Social History Narrative   Not on file   Social Determinants of Health   Financial Resource Strain: Not on file  Food Insecurity: Not on file  Transportation Needs: Not on file  Physical Activity: Not on file  Stress: Not on file  Social Connections: Not on file  Intimate Partner Violence: Not on file     History reviewed. No pertinent family history.   Current Facility-Administered Medications:    0.9 %  sodium chloride infusion, 10 mL/hr, Intravenous, Once, Sharman Cheek, MD  Current Outpatient Medications:    aspirin EC 81 MG EC tablet, Take 1 tablet (81 mg total) by mouth daily. Swallow whole., Disp: 30 tablet, Rfl: 0   atorvastatin (LIPITOR) 80 MG tablet, Take 1 tablet (80 mg total) by mouth daily., Disp: 90 tablet, Rfl: 3   clopidogrel (PLAVIX) 75 MG tablet, Take 1 tablet (75 mg total) by mouth daily with breakfast., Disp: 90 tablet, Rfl: 3   losartan (COZAAR) 25 MG tablet, Take 1 tablet (25 mg total) by mouth daily., Disp: 90 tablet, Rfl: 3  metoprolol succinate (TOPROL-XL) 25 MG 24 hr tablet, Take 1 tablet (25 mg total) by mouth daily., Disp: 90 tablet, Rfl: 3   vitamin B-12 1000 MCG tablet, Take 1 tablet (1,000 mcg total) by mouth daily., Disp: 30 tablet, Rfl: 0  Review of Systems  Constitutional:  Positive for fatigue. Negative for appetite change, chills, fever and unexpected weight change.  HENT:   Negative for hearing loss and voice change.   Eyes:  Negative for eye problems and icterus.  Respiratory:  Positive for shortness of breath. Negative for chest tightness and cough.   Cardiovascular:  Negative for chest pain and leg swelling.  Gastrointestinal:  Negative for abdominal distention and abdominal pain.   Endocrine: Negative for hot flashes.  Genitourinary:  Negative for difficulty urinating, dysuria and frequency.   Musculoskeletal:  Negative for arthralgias.  Skin:  Negative for itching and rash.  Neurological:  Negative for light-headedness and numbness.  Hematological:  Negative for adenopathy. Does not bruise/bleed easily.  Psychiatric/Behavioral:  Negative for confusion.     PHYSICAL EXAM Vitals:   02/01/23 0915 02/01/23 0916 02/01/23 1130  BP: (!) 161/95  129/67  Pulse: (!) 123  (!) 105  Resp: 20  20  Temp: 98.5 F (36.9 C)    TempSrc: Oral    SpO2: 100%  100%  Weight:  235 lb (106.6 kg)   Height:  6' (1.829 m)    Physical Exam Constitutional:      General: He is not in acute distress.    Appearance: He is not diaphoretic.  HENT:     Head: Normocephalic and atraumatic.  Eyes:     General: No scleral icterus. Cardiovascular:     Rate and Rhythm: Normal rate and regular rhythm.     Heart sounds: Murmur heard.     Comments: Soft murmur Pulmonary:     Effort: Pulmonary effort is normal. No respiratory distress.     Breath sounds: No wheezing.  Abdominal:     General: Bowel sounds are normal. There is no distension.     Palpations: Abdomen is soft.     Tenderness: There is no abdominal tenderness.  Musculoskeletal:        General: Normal range of motion.     Cervical back: Normal range of motion and neck supple.  Skin:    General: Skin is warm and dry.     Findings: No erythema.  Neurological:     Mental Status: He is alert and oriented to person, place, and time. Mental status is at baseline.     Cranial Nerves: No cranial nerve deficit.     Motor: No abnormal muscle tone.     Coordination: Coordination normal.  Psychiatric:        Mood and Affect: Mood and affect normal.       LABORATORY STUDIES    Latest Ref Rng & Units 02/01/2023    9:21 AM 02/21/2021    1:50 PM 02/07/2021    4:59 AM  CBC  WBC 4.0 - 10.5 K/uL 4.2  7.9  8.7   Hemoglobin 13.0 - 17.0  g/dL 4.9  40.3  47.4   Hematocrit 39.0 - 52.0 % 15.4  39.1  31.1   Platelets 150 - 400 K/uL 60  545  301       Latest Ref Rng & Units 02/01/2023    9:21 AM 02/21/2021    1:50 PM 02/08/2021    7:30 AM  CMP  Glucose 70 - 99 mg/dL 259  81  105   BUN 6 - 20 mg/dL 10  9  10    Creatinine 0.61 - 1.24 mg/dL 8.65  7.84  6.96   Sodium 135 - 145 mmol/L 136  141  137   Potassium 3.5 - 5.1 mmol/L 3.5  4.5  3.9   Chloride 98 - 111 mmol/L 103  103  101   CO2 22 - 32 mmol/L 22  19  27    Calcium 8.9 - 10.3 mg/dL 9.4  29.5  9.6   Total Protein 6.5 - 8.1 g/dL 7.6     Total Bilirubin 0.3 - 1.2 mg/dL 3.3     Alkaline Phos 38 - 126 U/L 39     AST 15 - 41 U/L 135     ALT 0 - 44 U/L 46        RADIOGRAPHIC STUDIES: I have personally reviewed the radiological images as listed and agreed with the findings in the report. CT ABDOMEN PELVIS W CONTRAST  Result Date: 02/01/2023 CLINICAL DATA:  Splenomegaly, anemia EXAM: CT ABDOMEN AND PELVIS WITH CONTRAST TECHNIQUE: Multidetector CT imaging of the abdomen and pelvis was performed using the standard protocol following bolus administration of intravenous contrast. RADIATION DOSE REDUCTION: This exam was performed according to the departmental dose-optimization program which includes automated exposure control, adjustment of the mA and/or kV according to patient size and/or use of iterative reconstruction technique. CONTRAST:  OMNIPAQUE IOHEXOL 300 MG/ML  SOLN COMPARISON:  None Available. FINDINGS: Lower chest: Visualized lower lung fields are clear. Hepatobiliary: Liver is enlarged measuring 22.4 cm. There is fatty infiltration. There is no dilation of bile ducts. Gallbladder is unremarkable. There is subtle low-attenuation in liver close to gallbladder fossa, possibly part of fatty infiltration or cyst or hemangioma. Pancreas: No focal abnormalities are seen. Spleen: Spleen measures 15.5 cm in maximum diameter. Adrenals/Urinary Tract: Adrenals are unremarkable. There  is no hydronephrosis. There are no renal or ureteral stones. Urinary bladder is not distended. There is mild diffuse wall thickening in the bladder. Stomach/Bowel: Stomach is unremarkable. Small bowel loops are not dilated. Appendix is not dilated. There is decreased density in submucosal region in ascending and transverse colon. There is no focal wall thickening. There is no pericolic stranding. Vascular/Lymphatic: Unremarkable. Reproductive: Unremarkable. Other: There is no ascites or pneumoperitoneum. Small umbilical hernia containing fat is seen. Musculoskeletal: There is minimal retrolisthesis at L5-S1 level. IMPRESSION: There is no evidence of intestinal obstruction or pneumoperitoneum. There is no hydronephrosis. Appendix is not dilated. Enlarged fatty liver.  Enlarged spleen. There is low-density in submucosal location in the ascending and transverse colon suggesting possible nonspecific chronic colitis. There is mild diffuse wall thickening in urinary bladder which may be due to incomplete distention or suggest cystitis. Electronically Signed   By: Ernie Avena M.D.   On: 02/01/2023 12:12   DG Chest 2 View  Result Date: 02/01/2023 CLINICAL DATA:  cp/ SOB EXAM: CHEST - 2 VIEW COMPARISON:  CXR 02/04/21 FINDINGS: No pleural effusion. No pneumothorax. Normal cardiac and mediastinal contours. No focal airspace opacity. No radiographically apparent displaced rib fractures. Visualized upper abdomen is unremarkable. Vertebral body heights are maintained. IMPRESSION: No focal airspace opacity Electronically Signed   By: Lorenza Cambridge M.D.   On: 02/01/2023 10:02     Assessment and plan-   # Acute severe anemia and thrombocytopenia.  Check smear,  LDH, haptoglobin, PT PTT, haptoglobin retic panel, B12, folate.  At the time of dictation, I reviewed the results that are available.  Smear showed schistocytes, tear drop cells, elevated bilirubin, [fractionated bilirubin pending], normal PT, normal Cr,  LDH is significantly elevated, haptoglobin is pending. B12 is pending. Concerning for MAHA, ie TTP/HUS.  CT showed hepatomegaly [fatty liver disease] and splenomegaly.  Check ADAMTS13 activity and transfer patient to tertiary center with plasma exchange capacity for further evaluaiton and management.   Thank you for allowing me to participate in the care of this patient.   Rickard Patience, MD, PhD Hematology Oncology 02/01/2023

## 2023-02-01 NOTE — Assessment & Plan Note (Signed)
T. bili 3.3 with noted scleral icterus in the setting of pancytopenia CT abdomen pelvis pending to further evaluate Fractionated bilirubin level also pending

## 2023-02-01 NOTE — ED Triage Notes (Signed)
Here by POV from home for intermittent CP, fatigue, tiredness, sob with exertion. Describes physical labor loading truck. Mentions h/o MI. No meds PTA. Denies NVD fever, or cough. Aleret, NAD, calm.

## 2023-02-01 NOTE — ED Notes (Signed)
EMTALA reviewed by this RN.  

## 2023-02-02 LAB — BPAM RBC
Blood Product Expiration Date: 202408142359
Blood Product Expiration Date: 202408162359
Blood Product Expiration Date: 202408162359
ISSUE DATE / TIME: 202407181303
ISSUE DATE / TIME: 202407181530
ISSUE DATE / TIME: 202407182255
Unit Type and Rh: 5100
Unit Type and Rh: 5100
Unit Type and Rh: 5100

## 2023-02-02 LAB — TYPE AND SCREEN
ABO/RH(D): O POS
Antibody Screen: NEGATIVE
Unit division: 0
Unit division: 0
Unit division: 0

## 2023-02-03 LAB — HAPTOGLOBIN: Haptoglobin: <10 mg/dL — ABNORMAL LOW (ref 17–317)

## 2023-07-15 IMAGING — CR DG CHEST 2V
2 series · 2 of 2 positions shown · non-contrast
Comparison: None.

CLINICAL DATA: Chest pain.

EXAM:
CHEST - 2 VIEW

[chest pa]
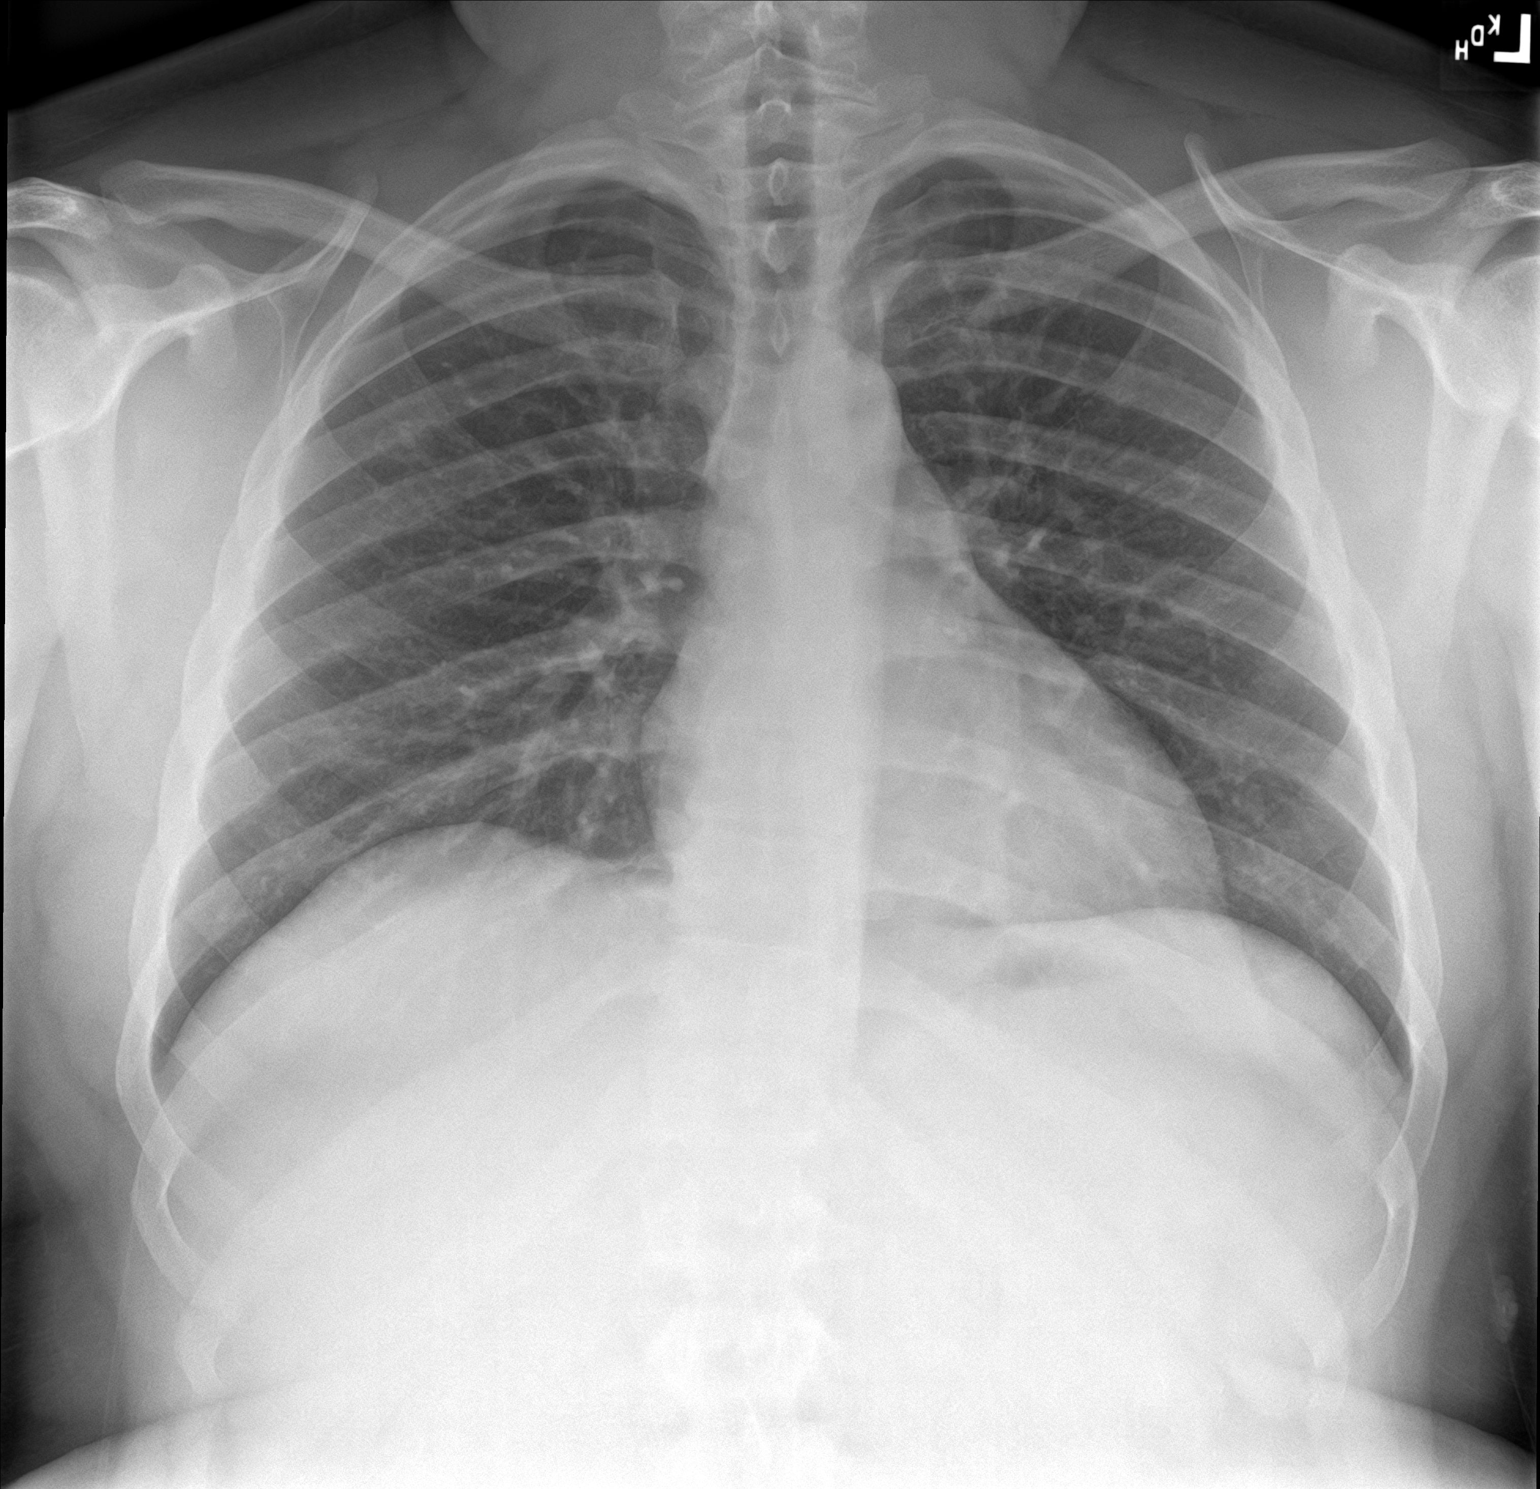

[chest lat]
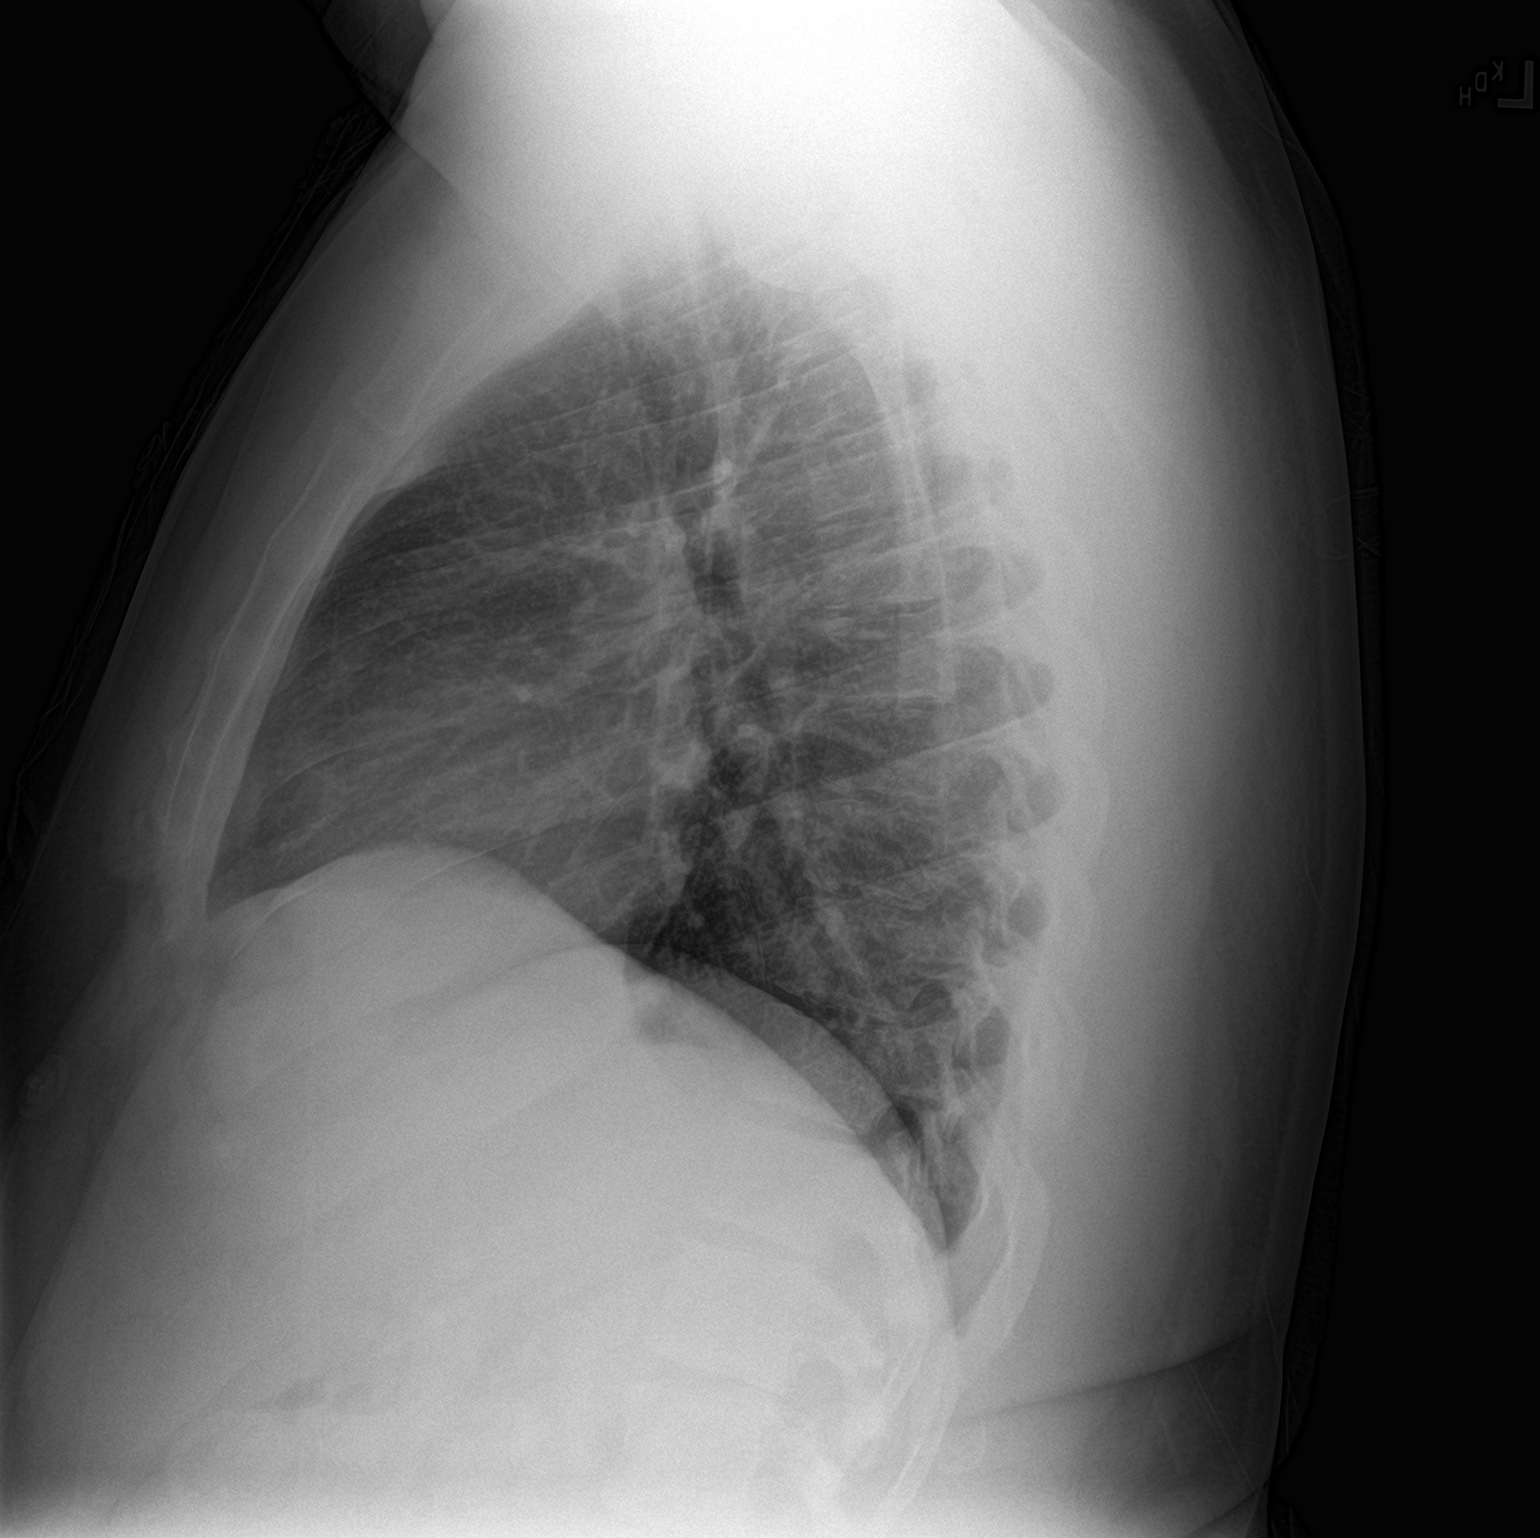

[2 of 2 positions shown; findings below may reference images not displayed]

FINDINGS: The heart size and mediastinal contours are within normal limits.
Both lungs are clear. The visualized skeletal structures are
unremarkable.
IMPRESSION: No active cardiopulmonary disease.
